# Patient Record
Sex: Male | Born: 1965 | Race: White | Hispanic: No | Marital: Married | Smoking: Former smoker
Health system: Southern US, Community
[De-identification: ages and names within clinical notes are randomized; demographics above are authoritative.]

## PROBLEM LIST (undated history)

## (undated) DIAGNOSIS — K219 Gastro-esophageal reflux disease without esophagitis: Secondary | ICD-10-CM

## (undated) DIAGNOSIS — I10 Essential (primary) hypertension: Secondary | ICD-10-CM

## (undated) DIAGNOSIS — M1990 Unspecified osteoarthritis, unspecified site: Secondary | ICD-10-CM

## (undated) DIAGNOSIS — E785 Hyperlipidemia, unspecified: Secondary | ICD-10-CM

## (undated) DIAGNOSIS — Z86010 Personal history of colonic polyps: Secondary | ICD-10-CM

## (undated) DIAGNOSIS — K449 Diaphragmatic hernia without obstruction or gangrene: Secondary | ICD-10-CM

## (undated) DIAGNOSIS — T7840XA Allergy, unspecified, initial encounter: Secondary | ICD-10-CM

## (undated) DIAGNOSIS — E039 Hypothyroidism, unspecified: Secondary | ICD-10-CM

## (undated) DIAGNOSIS — U071 COVID-19: Secondary | ICD-10-CM

## (undated) DIAGNOSIS — A048 Other specified bacterial intestinal infections: Secondary | ICD-10-CM

## (undated) HISTORY — PX: CHOLECYSTECTOMY: SHX55

## (undated) HISTORY — DX: Other specified bacterial intestinal infections: A04.8

## (undated) HISTORY — DX: Personal history of colonic polyps: Z86.010

## (undated) HISTORY — DX: Essential (primary) hypertension: I10

## (undated) HISTORY — DX: Hyperlipidemia, unspecified: E78.5

## (undated) HISTORY — PX: APPENDECTOMY: SHX54

## (undated) HISTORY — DX: COVID-19: U07.1

## (undated) HISTORY — DX: Unspecified osteoarthritis, unspecified site: M19.90

## (undated) HISTORY — DX: Gastro-esophageal reflux disease without esophagitis: K21.9

## (undated) HISTORY — PX: ANKLE SURGERY: SHX546

## (undated) HISTORY — DX: Allergy, unspecified, initial encounter: T78.40XA

## (undated) HISTORY — DX: Diaphragmatic hernia without obstruction or gangrene: K44.9

---

## 2013-08-10 ENCOUNTER — Ambulatory Visit (INDEPENDENT_AMBULATORY_CARE_PROVIDER_SITE_OTHER): Payer: 59 | Admitting: Internal Medicine

## 2013-08-10 ENCOUNTER — Encounter: Payer: Self-pay | Admitting: Internal Medicine

## 2013-08-10 VITALS — BP 140/90 | HR 80 | Temp 97.6°F | Resp 16 | Ht 70.0 in | Wt 184.0 lb

## 2013-08-10 DIAGNOSIS — Z5189 Encounter for other specified aftercare: Secondary | ICD-10-CM | POA: Insufficient documentation

## 2013-08-10 NOTE — Patient Instructions (Signed)
Wound Care Wound care helps prevent pain and infection.  You may need a tetanus shot if:  You cannot remember when you had your last tetanus shot.  You have never had a tetanus shot.  The injury broke your skin. If you need a tetanus shot and you choose not to have one, you may get tetanus. Sickness from tetanus can be serious. HOME CARE   Only take medicine as told by your doctor.  Clean the wound daily with mild soap and water.  Change any bandages (dressings) as told by your doctor.  Put medicated cream and a bandage on the wound as told by your doctor.  Change the bandage if it gets wet, dirty, or starts to smell.  Take showers. Do not take baths, swim, or do anything that puts your wound under water.  Rest and raise (elevate) the wound until the pain and puffiness (swelling) are better.  Keep all doctor visits as told. GET HELP RIGHT AWAY IF:   Yellowish-white fluid (pus) comes from the wound.  Medicine does not lessen your pain.  There is a red streak going away from the wound.  You have a fever. MAKE SURE YOU:   Understand these instructions.  Will watch your condition.  Will get help right away if you are not doing well or get worse. Document Released: 08/05/2008 Document Revised: 01/19/2012 Document Reviewed: 03/02/2011 Exit[employer] Patient Information 2014 [employer].  

## 2013-08-10 NOTE — Progress Notes (Signed)
  Subjective:    Patient ID: Gabriel Richards, male    DOB: [birth year], 47 y.o.   MRN: [id number]  Wound Check He was originally treated more than 14 days ago. Previous treatment included laceration repair. His temperature was unmeasured prior to arrival. There has been no drainage from the wound. There is no redness present. There is no swelling present. The pain has no pain. He has no difficulty moving the affected extremity or digit.      Review of Systems  All other systems reviewed and are negative.       Objective:   Physical Exam  Vitals reviewed. Constitutional: He is oriented to person, place, and time. He appears well-developed and well-nourished. No distress.  HENT:  Head: Normocephalic and atraumatic.  Mouth/Throat: Oropharynx is clear and moist. No oropharyngeal exudate.  Eyes: Conjunctivae are normal. Right eye exhibits no discharge. Left eye exhibits no discharge. No scleral icterus.  Neck: Normal range of motion. Neck supple. No JVD present. No tracheal deviation present. No thyromegaly present.  Cardiovascular: Normal rate, regular rhythm, normal heart sounds and intact distal pulses.  Exam reveals no gallop and no friction rub.   No murmur heard. Pulmonary/Chest: Effort normal and breath sounds normal. No stridor. No respiratory distress. He has no wheezes. He has no rales. He exhibits no tenderness.  Abdominal: Soft. Bowel sounds are normal. He exhibits no distension and no mass. There is no tenderness. There is no rebound and no guarding.  Musculoskeletal: Normal range of motion. He exhibits no edema and no tenderness.       Legs: Lymphadenopathy:    He has no cervical adenopathy.  Neurological: He is oriented to person, place, and time.  Skin: Skin is warm and dry. No rash noted. He is not diaphoretic. No erythema. No pallor.          Assessment & Plan:

## 2013-08-10 NOTE — Assessment & Plan Note (Signed)
Sutures were removed Wound is healing well without any signs of infection

## 2013-08-11 ENCOUNTER — Encounter: Payer: Self-pay | Admitting: Internal Medicine

## 2013-10-21 ENCOUNTER — Encounter: Payer: Self-pay | Admitting: Family Medicine

## 2013-10-21 ENCOUNTER — Other Ambulatory Visit (INDEPENDENT_AMBULATORY_CARE_PROVIDER_SITE_OTHER): Payer: 59

## 2013-10-21 ENCOUNTER — Ambulatory Visit (INDEPENDENT_AMBULATORY_CARE_PROVIDER_SITE_OTHER): Payer: 59 | Admitting: Family Medicine

## 2013-10-21 ENCOUNTER — Ambulatory Visit (INDEPENDENT_AMBULATORY_CARE_PROVIDER_SITE_OTHER)
Admission: RE | Admit: 2013-10-21 | Discharge: 2013-10-21 | Disposition: A | Discharge status: Home / Self Care | Payer: 59 | Source: Ambulatory Visit | Attending: Family Medicine | Admitting: Family Medicine

## 2013-10-21 VITALS — BP 140/86 | HR 78

## 2013-10-21 DIAGNOSIS — M25571 Pain in right ankle and joints of right foot: Secondary | ICD-10-CM

## 2013-10-21 DIAGNOSIS — M25579 Pain in unspecified ankle and joints of unspecified foot: Secondary | ICD-10-CM

## 2013-10-21 DIAGNOSIS — M19071 Primary osteoarthritis, right ankle and foot: Secondary | ICD-10-CM | POA: Insufficient documentation

## 2013-10-21 DIAGNOSIS — M19079 Primary osteoarthritis, unspecified ankle and foot: Secondary | ICD-10-CM

## 2013-10-21 NOTE — Assessment & Plan Note (Signed)
Patient does have severe osteophytic changes on physical exam in ultrasound today. X-rays were ordered reviewed and interpreted by me today. Patient does have severe osteophytic changes and does have postsurgical changes of the talus. I do not see any loosening of the screw head. This excluded from the differential. Unfortunately patient is having almost autofusion of the ankle mortise. We'll start with conservative therapy including bracing, home exercises, and over-the-counter medications. Patient will come back in 4 weeks and if he continues to have pain to consider doing a corticosteroid injection. Patient may need arthroscopic surgery to better release of this pain. We'll discuss further at followup.

## 2013-10-21 NOTE — Progress Notes (Signed)
Pre-visit discussion using our clinic review tool. No additional management support is needed unless otherwise documented below in the visit note.  

## 2013-10-21 NOTE — Patient Instructions (Signed)
Very nice to meet you Try the brace with activity. Try exercises on the CD.  Ice baths 20 minutes at end of day.  Take tylenol 650 mg three times a day is the best evidence based medicine we have for arthritis.  Glucosamine sulfate 750mg  twice a day is a supplement that has been shown to help moderate to severe arthritis. Vitamin D 1000 IU daily Fish oil 2 grams daily.  Tumeric 500mg  twice daily.  Capsaicin topically up to four times a day may also help with pain. We will get xrays today, go downstairs I will call you with the results.  Shoe inserts with good arch support may be helpful.  [employer] [facility] at [employer] [facility] could help.  Water aerobics and cycling with low resistance are the best two types of exercise for arthritis. Come back and see me in 3-4 weeks. '

## 2013-10-21 NOTE — Progress Notes (Signed)
I'm seeing this patient by the request  of:  Sanda Linger, MD   CC: Right ankle pain  HPI: Patient is a very pleasant 47 year old gentleman who back in 1988 off a 12 foot ladder and shattered his ankle. Patient did have surgery but does not remember exactly what type. Patient states over the course last 2 years she's been having worsening pain of his ankle. Patient is seen a podiatrist in an orthopedic surgeon for this pain previously. Patient was fitted with what sounded to be a custom posterior splint that seem to hurt more than help. Patient states that the pain seems to be more the anterior lateral aspect of the ankle. Patient says that the pain is worse with walking states there is some time swelling denies any numbness or tingling. Patient states that he is a throbbing pain at night they can be 10 out of 10 and states that Tylenol does not seem to be helpful   Past medical, surgical, family and social history reviewed. Medications reviewed all in the electronic medical record.   Review of Systems: No headache, visual changes, nausea, vomiting, diarrhea, constipation, dizziness, abdominal pain, skin rash, fevers, chills, night sweats, weight loss, swollen lymph nodes, body aches, joint swelling, muscle aches, chest pain, shortness of breath, mood changes.   Objective:    Blood pressure 140/86, pulse 78, SpO2 96.00%.   General: No apparent distress alert and oriented x3 mood and affect normal, dressed appropriately.  HEENT: Pupils equal, extraocular movements intact Respiratory: Patient's speak in full sentences and does not appear short of breath Cardiovascular: No lower extremity edema, non tender, no erythema Skin: Warm dry intact with no signs of infection or rash on extremities or on axial skeleton. Abdomen: Soft nontender Neuro: Cranial nerves II through XII are intact, neurovascularly intact in all extremities with 2+ DTRs and 2+ pulses. Lymph: No lymphadenopathy of posterior or  anterior cervical chain or axillae bilaterally. .  MSK: Non tender with full range of motion and good stability and symmetric strength and tone of shoulders, elbows, wrist, hip, knees bilaterally.  Ankle: Right On inspection patient does have trace amount of effusion on the lateral aspect. There is a incision over the anterior lateral aspect of the fibula. Patient has severe loss of range of motion in flexion and extension and the most entire loss of inversion and eversion. Strength is 5/5 in all directions. Stable lateral and medial ligaments; squeeze test and kleiger test unremarkable; Talar dome moderate tenderness No pain at base of 5th MT; No tenderness over cuboid; No tenderness over N spot or navicular prominence Patient does have some pain over the medial aspect of the fibula as well as the posterior aspect of the fibula No sign of peroneal tendon subluxations or tenderness to palpation Negative tarsal tunnel tinel's Able to walk 4 steps. Patient does ambulate with external rotated leg and a fixed ankle. This is very significant abnormal gait Contralateral ankle unremarkable  MSK US performed of: Right ankle This study was ordered, performed, and interpreted by Terrilee Files D.O.  Foot/Ankle:   All structures visualized.   Talar dome show severe os or arthritic changes Ankle mortise does have mild effusion Peroneus longus and brevis tendons unremarkable on long and transverse views without sheath effusions. Posterior tibialis, flexor hallucis longus, and flexor digitorum longus tendons unremarkable on long and transverse views without sheath effusions. Achilles tendon visualized along length of tendon and unremarkable on long and transverse views without sheath effusion. Anterior Talofibular Ligament does  not appear to be intact and does have hypoechoic changes in the area Deltoid Ligament unremarkable and intact. Plantar fascia intact and without effusion, normal thickness. No  increased doppler signal, cap sign, or thickening of tibial cortex. Power doppler signal normal.  IMPRESSION: Severe osteoarthritic changes of the ankle    Impression and Recommendations:     This case required medical decision making of moderate complexity.

## 2013-11-10 HISTORY — PX: UPPER GASTROINTESTINAL ENDOSCOPY: SHX188

## 2013-12-14 ENCOUNTER — Encounter: Payer: Self-pay | Admitting: Physician Assistant

## 2013-12-19 ENCOUNTER — Encounter: Payer: Self-pay | Admitting: Physician Assistant

## 2013-12-19 ENCOUNTER — Ambulatory Visit (INDEPENDENT_AMBULATORY_CARE_PROVIDER_SITE_OTHER): Payer: 59 | Admitting: Physician Assistant

## 2013-12-19 VITALS — BP 132/70 | HR 76 | Ht 70.0 in | Wt 190.0 lb

## 2013-12-19 DIAGNOSIS — R6881 Early satiety: Secondary | ICD-10-CM

## 2013-12-19 DIAGNOSIS — I10 Essential (primary) hypertension: Secondary | ICD-10-CM | POA: Insufficient documentation

## 2013-12-19 DIAGNOSIS — R1013 Epigastric pain: Secondary | ICD-10-CM

## 2013-12-19 NOTE — Progress Notes (Signed)
Reviewed and agree with management plan.  Thang Flett T. Atha Mcbain, MD FACG 

## 2013-12-19 NOTE — Progress Notes (Signed)
Subjective:    Patient ID: Gabriel Richards, male    DOB: Jul 31, 1966, 48 y.o.   MRN: [id number]  HPI  Gabriel Richards is a pleasant 48 year old white male new to GI today, self-referred. He has history of hypertension and hyperlipidemia. He says he has history of acid reflux and has been on omeprazole over the past few years. He is now having a somewhat progressive symptoms with epigastric and subxiphoid discomfort. He says when he eats he feels like he fills  Up quickly andas if something in his abdomen is pushing up on his lungs.. He has no dysphagia or odynophagia. In the evenings when he lies down to go to bed he says he gets a gas buildup type of feeling in his lower chest area and has hard time getting to sleep. He has been taking a little bit of mag citrate before bedtime which was helpful for this and now recently a switch to vinegar  which seems to help as well. He has not had any nausea or vomiting. No changes in his bowel habits melena or hematochezia. His weight has been stable. He is not on any regular aspirin or NSAIDs and does not drink any alcohol regularly. Family history is negative for GI disease. He is status post appendectomy.    Review of Systems  Constitutional: Positive for appetite change.  HENT: Negative.   Eyes: Negative.   Respiratory: Positive for shortness of breath.   Cardiovascular: Negative.   Gastrointestinal: Positive for abdominal pain and abdominal distention.  Endocrine: Negative.   Genitourinary: Negative.   Allergic/Immunologic: Negative.   Neurological: Negative.   Hematological: Negative.   Psychiatric/Behavioral: Negative.    Outpatient Encounter Prescriptions as of 12/19/2013  Medication Sig  . Cholecalciferol (HM VITAMIN D3) 4000 UNITS CAPS Take by mouth. Once daily  . lisinopril (PRINIVIL,ZESTRIL) 10 MG tablet Take 10 mg by mouth daily.  Marland Kitchen omeprazole (PRILOSEC) 40 MG capsule Take 40 mg by mouth daily.  . rosuvastatin (CRESTOR) 10 MG tablet Take 10 mg by  mouth daily.    No Known Allergies Patient Active Problem List   Diagnosis Date Noted  . HTN (hypertension) 12/19/2013  . Arthritis of ankle, right, degenerative 10/21/2013  . Encounter for wound Richards-check 08/10/2013   History  Substance Use Topics  . Smoking status: Never Smoker   . Smokeless tobacco: Never Used  . Alcohol Use: No   family history includes Diabetes in his father; Heart disease in his father; Hyperlipidemia in his brother and father; Hypertension in his brother and father. There is no history of Early death, Cancer, COPD, Drug abuse, Alcohol abuse, Kidney disease, or Stroke.     Objective:   Physical Exam well-developed white male in no acute distress, pleasant blood pressure 132/70 pulse 76 height 5 foot 10 weight 190. H;EENT ;nontraumatic normocephalic EOMI PERRLA sclera anicteric, Supple; no JVD, Cardiovascular regular rate and rhythm with S1-S2 no murmur or gallop, Pulmonary clear bilaterally, Abdomen; soft nondistended bowel sounds are present there is no succussion splash is some mild tenderness in the epigastrium no guarding or rebound, Rectal; exam not done, Extremities; no clubbing cyanosis or edema skin warm and dry, Psych; mood and affect appropriate        Assessment & Plan:  #48  48 year old white male with history of chronic GERD now with progressive symptoms of epigastric and subxiphoid discomfort, pressure and early satiety postprandially and at nighttime. His symptoms may all be due to acid reflux and perhaps a hiatal hernia, rule out  occult lesion, rule out partially intrathoracic stomach  #2 hypertension  Plan; Increase omeprazole to 40 mg by mouth twice daily with a second dose at dinnertime Patient signed a release we can get copies of his recent labs done for a physical with  his primary care physician's office Schedule for upper endoscopy with Dr. Merita Norton discussed in detail with patient he is agreeable to proceed Further plans pending  results of above

## 2013-12-19 NOTE — Patient Instructions (Signed)
You have been scheduled for an endoscopy with propofol. Please follow written instructions given to you at your visit today. If you use inhalers (even only as needed), please bring them with you on the day of your procedure. 

## 2013-12-22 ENCOUNTER — Encounter: Payer: Self-pay | Admitting: Gastroenterology

## 2013-12-23 ENCOUNTER — Encounter: Payer: Self-pay | Admitting: Gastroenterology

## 2013-12-23 ENCOUNTER — Ambulatory Visit (AMBULATORY_SURGERY_CENTER): Payer: 59 | Admitting: Gastroenterology

## 2013-12-23 VITALS — BP 132/80 | HR 66 | Temp 96.8°F | Resp 20 | Ht 70.0 in | Wt 190.0 lb

## 2013-12-23 DIAGNOSIS — R6881 Early satiety: Secondary | ICD-10-CM

## 2013-12-23 DIAGNOSIS — A048 Other specified bacterial intestinal infections: Secondary | ICD-10-CM

## 2013-12-23 DIAGNOSIS — R1013 Epigastric pain: Secondary | ICD-10-CM

## 2013-12-23 DIAGNOSIS — K2990 Gastroduodenitis, unspecified, without bleeding: Secondary | ICD-10-CM

## 2013-12-23 DIAGNOSIS — K219 Gastro-esophageal reflux disease without esophagitis: Secondary | ICD-10-CM

## 2013-12-23 DIAGNOSIS — K2970 Gastritis, unspecified, without bleeding: Secondary | ICD-10-CM

## 2013-12-23 DIAGNOSIS — Z8619 Personal history of other infectious and parasitic diseases: Secondary | ICD-10-CM | POA: Insufficient documentation

## 2013-12-23 MED ORDER — SODIUM CHLORIDE 0.9 % IV SOLN: 500.0000 mL | INTRAVENOUS | Status: DC

## 2013-12-23 NOTE — Patient Instructions (Addendum)

## 2013-12-23 NOTE — Progress Notes (Signed)
Called to room to assist during endoscopic procedure.  Patient ID and intended procedure confirmed with present staff. Received instructions for my participation in the procedure from the performing physician.  

## 2013-12-23 NOTE — Progress Notes (Signed)
No egg or soy allergy. ewm No problems with past sedation. ewm

## 2013-12-23 NOTE — Op Note (Signed)
[place]  [address] [place], [id number]   ENDOSCOPY PROCEDURE REPORT  PATIENT: Gabriel Richards, Gabriel Richards  MR#: [id number] BIRTHDATE: 30-Nov-1965 , 56  yrs. old GENDER: Male ENDOSCOPIST: Ladene Artist, MD, [facility] PROCEDURE DATE:  12/23/2013 PROCEDURE:  EGD w/ biopsy ASA CLASS:     Class II INDICATIONS:  Epigastric pain.   early satiety.   History of esophageal reflux. MEDICATIONS: MAC sedation, administered by CRNA and propofol (Diprivan) 250mg  IV TOPICAL ANESTHETIC: none DESCRIPTION OF PROCEDURE: After the risks benefits and alternatives of the procedure were thoroughly explained, informed consent was obtained.  The LB [id number] endoscope was introduced through the mouth and advanced to the second portion of the duodenum. Without limitations.  The instrument was slowly withdrawn as the mucosa was fully examined.   ESOPHAGUS: The mucosa of the esophagus appeared normal. STOMACH: Mild gastritis  was found in the gastric body and gastric fundus.  Multiple biopsies were performed.   The stomach otherwise appeared normal. DUODENUM: The duodenal mucosa showed no abnormalities in the bulb and second portion of the duodenum.  Retroflexed views revealed a small hiatal hernia.     The scope was then withdrawn from the patient and the procedure completed.  COMPLICATIONS: There were no complications.  ENDOSCOPIC IMPRESSION: 1.   Small hiatal hernia 2.   Gastritis in the gastric body and gastric fundus; multiple biopsies  RECOMMENDATIONS: 1.  Anti-reflux regimen 2.  Continue PPI BID 3.  Await pathology 4.  Call to schedule a follow-up appointment with office 4-6weeks   eSigned:  Ladene Artist, MD, [facility] 12/23/2013 4:27 PM

## 2013-12-23 NOTE — Progress Notes (Signed)
A/ox3 pleased with MAC, report to April RN 

## 2013-12-26 ENCOUNTER — Telehealth: Payer: Self-pay | Admitting: *Deleted

## 2013-12-26 NOTE — Telephone Encounter (Signed)
  Follow up Call-  Call back number 12/23/2013  Post procedure Call Back phone  # [phone]  Permission to leave phone message Yes     Patient questions:  Do you have a fever, pain , or abdominal swelling? no Pain Score  0 *  Have you tolerated food without any problems? yes  Have you been able to return to your normal activities? yes  Do you have any questions about your discharge instructions: Diet   no Medications  no Follow up visit  no  Do you have questions or concerns about your Care? no  Actions: * If pain score is 4 or above: No action needed, pain <4.

## 2013-12-29 ENCOUNTER — Encounter: Payer: Self-pay | Admitting: Gastroenterology

## 2013-12-30 ENCOUNTER — Encounter: Payer: Self-pay | Admitting: Physician Assistant

## 2013-12-30 ENCOUNTER — Telehealth: Payer: Self-pay | Admitting: Gastroenterology

## 2013-12-30 ENCOUNTER — Other Ambulatory Visit: Payer: Self-pay

## 2013-12-30 MED ORDER — BIS SUBCIT-METRONID-TETRACYC 140-125-125 MG PO CAPS: 3.0000 | ORAL_CAPSULE | Freq: Three times a day (TID) | ORAL | Status: DC

## 2013-12-30 MED ORDER — OMEPRAZOLE 40 MG PO CPDR: 40.0000 mg | DELAYED_RELEASE_CAPSULE | Freq: Two times a day (BID) | ORAL | Status: DC

## 2013-12-30 NOTE — Telephone Encounter (Signed)
Informed patient's husband that we have a sample 10 day pac of Pylera I can give to the patient. Tammy states she will pick the samples on the dumb waiter.

## 2014-01-09 ENCOUNTER — Telehealth: Payer: Self-pay | Admitting: Gastroenterology

## 2014-01-09 NOTE — Telephone Encounter (Signed)
Patient is currently taking Pylera.  He is advised that dark loose stools are the most likely coming from Pylera.  He is asked to call back if the symptoms persist after several days off of Pylera

## 2014-02-27 ENCOUNTER — Ambulatory Visit: Payer: 59 | Admitting: Gastroenterology

## 2014-03-28 ENCOUNTER — Other Ambulatory Visit: Payer: Self-pay | Admitting: Gastroenterology

## 2014-03-29 ENCOUNTER — Telehealth: Payer: Self-pay | Admitting: Gastroenterology

## 2014-03-29 MED ORDER — OMEPRAZOLE 40 MG PO CPDR: 40.0000 mg | DELAYED_RELEASE_CAPSULE | Freq: Two times a day (BID) | ORAL | Status: DC

## 2014-03-29 NOTE — Telephone Encounter (Signed)
Prescription sent to patient's pharmacy.

## 2014-04-13 ENCOUNTER — Encounter: Payer: Self-pay | Admitting: Gastroenterology

## 2014-04-13 ENCOUNTER — Ambulatory Visit (INDEPENDENT_AMBULATORY_CARE_PROVIDER_SITE_OTHER): Payer: 59 | Admitting: Gastroenterology

## 2014-04-13 VITALS — BP 120/84 | HR 72 | Ht 68.5 in | Wt 190.2 lb

## 2014-04-13 DIAGNOSIS — K219 Gastro-esophageal reflux disease without esophagitis: Secondary | ICD-10-CM

## 2014-04-13 NOTE — Progress Notes (Signed)
    History of Present Illness: This is a 48 year old male with GERD and a history of H. pylori gastritis treated in February. He has no GI complaints.  Current Medications, Allergies, Past Medical History, Past Surgical History, Family History and Social History were reviewed in [employer] record.  Physical Exam: General: Well developed , well nourished, no acute distress Head: Normocephalic and atraumatic Eyes:  sclerae anicteric, EOMI Ears: Normal auditory acuity Mouth: No deformity or lesions Lungs: Clear throughout to auscultation Heart: Regular rate and rhythm; no murmurs, rubs or bruits Abdomen: Soft, non tender and non distended. No masses, hepatosplenomegaly or hernias noted. Normal Bowel sounds Musculoskeletal: Symmetrical with no gross deformities  Extremities: No clubbing, cyanosis, edema or deformities noted Neurological: Alert oriented x 4, grossly nonfocal Psychological:  Alert and cooperative. Normal mood and affect  Assessment and Recommendations:  1. H. pylori gastritis, treated.   2. GERD. Continue standard antireflux measures and omeprazole 40 mg twice a day.

## 2014-04-13 NOTE — Patient Instructions (Signed)
Thank you for choosing me and [facility].  Malcolm T. Stark, Jr., MD., FACG  

## 2014-05-18 ENCOUNTER — Other Ambulatory Visit: Payer: Self-pay | Admitting: Gastroenterology

## 2015-02-22 ENCOUNTER — Encounter (HOSPITAL_BASED_OUTPATIENT_CLINIC_OR_DEPARTMENT_OTHER): Payer: Self-pay | Admitting: *Deleted

## 2015-02-22 ENCOUNTER — Emergency Department (HOSPITAL_BASED_OUTPATIENT_CLINIC_OR_DEPARTMENT_OTHER): Payer: 59

## 2015-02-22 ENCOUNTER — Emergency Department (HOSPITAL_BASED_OUTPATIENT_CLINIC_OR_DEPARTMENT_OTHER)
Admission: EM | Admit: 2015-02-22 | Discharge: 2015-02-22 | Disposition: A | Discharge status: Home / Self Care | Payer: 59 | Attending: Emergency Medicine | Admitting: Emergency Medicine

## 2015-02-22 DIAGNOSIS — Y998 Other external cause status: Secondary | ICD-10-CM | POA: Diagnosis not present

## 2015-02-22 DIAGNOSIS — Z7982 Long term (current) use of aspirin: Secondary | ICD-10-CM | POA: Insufficient documentation

## 2015-02-22 DIAGNOSIS — Z87891 Personal history of nicotine dependence: Secondary | ICD-10-CM | POA: Insufficient documentation

## 2015-02-22 DIAGNOSIS — K219 Gastro-esophageal reflux disease without esophagitis: Secondary | ICD-10-CM | POA: Diagnosis not present

## 2015-02-22 DIAGNOSIS — X58XXXA Exposure to other specified factors, initial encounter: Secondary | ICD-10-CM | POA: Diagnosis not present

## 2015-02-22 DIAGNOSIS — I10 Essential (primary) hypertension: Secondary | ICD-10-CM | POA: Insufficient documentation

## 2015-02-22 DIAGNOSIS — M1990 Unspecified osteoarthritis, unspecified site: Secondary | ICD-10-CM | POA: Insufficient documentation

## 2015-02-22 DIAGNOSIS — S29011A Strain of muscle and tendon of front wall of thorax, initial encounter: Secondary | ICD-10-CM | POA: Diagnosis not present

## 2015-02-22 DIAGNOSIS — Z8619 Personal history of other infectious and parasitic diseases: Secondary | ICD-10-CM | POA: Insufficient documentation

## 2015-02-22 DIAGNOSIS — Y9389 Activity, other specified: Secondary | ICD-10-CM | POA: Diagnosis not present

## 2015-02-22 DIAGNOSIS — Z79899 Other long term (current) drug therapy: Secondary | ICD-10-CM | POA: Diagnosis not present

## 2015-02-22 DIAGNOSIS — S299XXA Unspecified injury of thorax, initial encounter: Secondary | ICD-10-CM | POA: Diagnosis present

## 2015-02-22 DIAGNOSIS — Y9289 Other specified places as the place of occurrence of the external cause: Secondary | ICD-10-CM | POA: Insufficient documentation

## 2015-02-22 NOTE — ED Notes (Signed)
Pt reports was lifting commercial refrigerator 2 days ago over a step and felt a pop in right side of chest- felt better until this morning when he sneezed and the pain got worse

## 2015-02-22 NOTE — ED Provider Notes (Signed)
CSN: [id number]     Arrival date & time 02/22/15  1228 History   First MD Initiated Contact with Patient 02/22/15 1500     Chief Complaint  Patient presents with  . Chest Pain    Rib pain     (Consider location/radiation/quality/duration/timing/severity/associated sxs/prior Treatment) HPI  49 year old male presents with right anterior chest wall pain since lifting a refrigerator 2 days ago. He was pulling the dolly with his right arm and bowel straining he ended up feeling a pop and immediate pain under his right breast. Occasionally hurts to breathe. Ibuprofen does help the pain moderately. Last night he went to bed with no pain but this morning after sneezing the pain recurred. No bruising noted by the patient. No fevers or chills. No shortness of breath. Rates the pain as moderate at this time.  Past Medical History  Diagnosis Date  . Hypertension   . Arthritis   . GERD (gastroesophageal reflux disease)   . Allergy   . H. pylori infection   . Hiatal hernia    Past Surgical History  Procedure Laterality Date  . Appendectomy    . Ankle surgery     Family History  Problem Relation Age of Onset  . Hyperlipidemia Father   . Heart disease Father   . Hypertension Father   . Diabetes Father   . Hypertension Brother   . Hyperlipidemia Brother   . Early death Neg Hx   . Cancer Neg Hx   . COPD Neg Hx   . Drug abuse Neg Hx   . Alcohol abuse Neg Hx   . Kidney disease Neg Hx   . Stroke Neg Hx   . Colon cancer Neg Hx   . Esophageal cancer Neg Hx   . Rectal cancer Neg Hx   . Stomach cancer Neg Hx    History  Substance Use Topics  . Smoking status: Former Research scientist (life sciences)  . Smokeless tobacco: Never Used  . Alcohol Use: No    Review of Systems  Constitutional: Negative for fever.  Respiratory: Negative for shortness of breath.   Cardiovascular: Positive for chest pain.  Gastrointestinal: Negative for vomiting.  All other systems reviewed and are negative.     Allergies    Review of patient's allergies indicates no known allergies.  Home Medications   Prior to Admission medications   Medication Sig Start Date End Date Taking? Authorizing Provider  aspirin 81 MG tablet Take 81 mg by mouth daily.   Yes Historical Provider, MD  Cholecalciferol (HM VITAMIN D3) 4000 UNITS CAPS Take by mouth. Once daily   Yes Historical Provider, MD  omeprazole (PRILOSEC) 40 MG capsule TAKE 1 CAPSULE BY MOUTH TWICE DAILY 05/18/14  Yes Ladene Artist, MD  rosuvastatin (CRESTOR) 10 MG tablet Take 10 mg by mouth daily.   Yes Historical Provider, MD  lisinopril (PRINIVIL,ZESTRIL) 10 MG tablet Take 10 mg by mouth daily.    Historical Provider, MD   BP 163/87 mmHg  Pulse 78  Temp(Src) 97.7 F (36.5 C) (Oral)  Resp 18  Ht 5\' 10"  (1.778 m)  Wt 190 lb (86.183 kg)  BMI 27.26 kg/m2  SpO2 98% Physical Exam  Constitutional: He is oriented to person, place, and time. He appears well-developed and well-nourished.  HENT:  Head: Normocephalic and atraumatic.  Right Ear: External ear normal.  Left Ear: External ear normal.  Nose: Nose normal.  Eyes: Right eye exhibits no discharge. Left eye exhibits no discharge.  Neck: Neck supple.  Cardiovascular:  Normal rate, regular rhythm, normal heart sounds and intact distal pulses.   Pulmonary/Chest: Effort normal and breath sounds normal. He exhibits tenderness.    Abdominal: Soft. He exhibits no distension. There is no tenderness.  Neurological: He is alert and oriented to person, place, and time.  Skin: Skin is warm and dry.  Nursing note and vitals reviewed.   ED Course  Procedures (including critical care time) Labs Review Labs Reviewed - No data to display  Imaging Review Dg Chest 2 View  02/22/2015   CLINICAL DATA:  Right-sided chest pain and mild difficulty breathing  EXAM: CHEST  2 VIEW  COMPARISON:  None.  FINDINGS: There is minimal scarring in each lung base. Lungs elsewhere clear. Heart size and pulmonary vascularity are  normal. No adenopathy. No pneumothorax. No bone lesions.  IMPRESSION: Slight bibasilar scarring.  No edema or consolidation.   Electronically Signed   By: Lowella Grip III M.D.   On: 02/22/2015 13:50     EKG Interpretation None      MDM   Final diagnoses:  Chest wall muscle strain, initial encounter    Patient symptoms are consistent with a chest wall muscle strain. Patient feels comfortable taking ibuprofen and Tylenol at home, will ice as needed as well. Offered stronger pain medicine but this point he feels that this is adequate. This is not consistent with ACS, PE, or other acute chest pathology given this occurred as an injury and is very reproducible. Stable for discharge home.    Sherwood Gambler, MD 02/22/15 [phone]

## 2015-06-11 ENCOUNTER — Other Ambulatory Visit: Payer: Self-pay | Admitting: Gastroenterology

## 2015-07-04 ENCOUNTER — Ambulatory Visit: Payer: 59 | Admitting: Family Medicine

## 2015-07-10 ENCOUNTER — Other Ambulatory Visit (INDEPENDENT_AMBULATORY_CARE_PROVIDER_SITE_OTHER): Payer: Commercial Managed Care - HMO

## 2015-07-10 ENCOUNTER — Ambulatory Visit (INDEPENDENT_AMBULATORY_CARE_PROVIDER_SITE_OTHER): Payer: Commercial Managed Care - HMO | Admitting: Family Medicine

## 2015-07-10 ENCOUNTER — Encounter: Payer: Self-pay | Admitting: Family Medicine

## 2015-07-10 VITALS — BP 132/78 | HR 99 | Wt 192.0 lb

## 2015-07-10 DIAGNOSIS — M79644 Pain in right finger(s): Secondary | ICD-10-CM

## 2015-07-10 DIAGNOSIS — S92911A Unspecified fracture of right toe(s), initial encounter for closed fracture: Secondary | ICD-10-CM

## 2015-07-10 DIAGNOSIS — M6530 Trigger finger, unspecified finger: Secondary | ICD-10-CM

## 2015-07-10 NOTE — Assessment & Plan Note (Signed)
Patient was put in a postop boot. We discussed that this can take 3 weeks. Increase his vitamin D 5000 daily. We discussed icing regimen. 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 Patient will need custom orthotics in the long run. Return in 3 weeks.

## 2015-07-10 NOTE — Patient Instructions (Signed)
Good to see you Ice 20 minutes 2 times daily. Usually after activity and before bed. Wear brace today and tonight then nightly for 2 weeks Ibuprofen if you need it  See me again in 2 weeks if not perfect,

## 2015-07-10 NOTE — Assessment & Plan Note (Signed)
Patient does have more of a trigger finger. Patient was given an injection today in the middle finger. Didn't tolerate very well. We discussed icing regimen, and bracing for the next 2 weeks, and discussed that we can do this again. Prognosis is good. Follow-up in 2-3 weeks.

## 2015-07-10 NOTE — Progress Notes (Signed)
Pre visit review using our clinic review tool, if applicable. No additional management support is needed unless otherwise documented below in the visit note. 

## 2015-07-10 NOTE — Progress Notes (Signed)
[facility] [address] [place], [place] [address] Phone: [phone] Subjective:      CC: Right hand pain Right foot pain  [id number] Gabriel Richards is a 49 y.o. male coming in with complaint of complaint of 2 problems.  Right hand pain. Patient states the middle finger seems to get stuck especially in the mornings in a flexed position. States it's very painful to straighten. Starting affects some of his job. Patient denies any nighttime awakening secondary to. Denies any numbness or weakness. Patient though is in [employer] and is having difficulty continuing his job.  Patient is also having a right foot pain. Patient states that 2 months ago he dropped something on his foot. Had some swelling as well as bruising initially. States since then it seems to be worsening. Just proximal to the first toe is where most of his pain as he states. Worse with ambulation. Denies any numbness. Denies any swelling or bruising now. Makes it difficult to walk a lot. No nighttime awakening.  Past Medical History  Diagnosis Date  . Hypertension   . Arthritis   . GERD (gastroesophageal reflux disease)   . Allergy   . H. pylori infection   . Hiatal hernia    Past Surgical History  Procedure Laterality Date  . Appendectomy    . Ankle surgery     Social History  Substance Use Topics  . Smoking status: Former Research scientist (life sciences)  . Smokeless tobacco: Never Used  . Alcohol Use: No   No Known Allergies Family History  Problem Relation Age of Onset  . Hyperlipidemia Father   . Heart disease Father   . Hypertension Father   . Diabetes Father   . Hypertension Brother   . Hyperlipidemia Brother   . Early death Neg Hx   . Cancer Neg Hx   . COPD Neg Hx   . Drug abuse Neg Hx   . Alcohol abuse Neg Hx   . Kidney disease Neg Hx   . Stroke Neg Hx   . Colon cancer Neg Hx   . Esophageal cancer Neg Hx   . Rectal cancer Neg Hx   . Stomach cancer Neg Hx         Past  medical history, social, surgical and family history all reviewed in electronic medical record.   Review of Systems: No headache, visual changes, nausea, vomiting, diarrhea, constipation, dizziness, abdominal pain, skin rash, fevers, chills, night sweats, weight loss, swollen lymph nodes, body aches, joint swelling, muscle aches, chest pain, shortness of breath, mood changes.   Objective Blood pressure 132/78, pulse 99, weight 192 lb (87.091 kg), SpO2 95 %.  General: No apparent distress alert and oriented x3 mood and affect normal, dressed appropriately.  HEENT: Pupils equal, extraocular movements intact  Respiratory: Patient's speak in full sentences and does not appear short of breath  Cardiovascular: No lower extremity edema, non tender, no erythema  Skin: Warm dry intact with no signs of infection or rash on extremities or on axial skeleton.  Abdomen: Soft nontender  Neuro: Cranial nerves II through XII are intact, neurovascularly intact in all extremities with 2+ DTRs and 2+ pulses.  Lymph: No lymphadenopathy of posterior or anterior cervical chain or axillae bilaterally.  Gait normal with good balance and coordination.  MSK:  Non tender with full range of motion and good stability and symmetric strength and tone of shoulders, elbows, wrist, hip, knee and ankles bilaterally.  Hand exam: On patient's right hand third  finger does have a trigger finger at the A2 pulley. Tender to palpation. Neurovascularly intact distally. Does have full range of motion. Minor trigger noted. Good capillary refill. Full range of motion and strength at the wrist. Foot exam: Right foot exam shows the patient does have fibular deviation of the large toe as well as pes planus. Patient is tender to palpation just proximal to the MTP joint on the first toe. Neurovascularly intact distally.  Procedure note After verbal consent patient was prepped with alcohol swab and with a 25-gauge 1 inch needle patient was injected  with a total of 0.5 mL of 0.5% Marcaine and 0.5 mL of Kenalog 40 mg/dL within the tendon sheath of the middle finger on the flexor portion of the hand. This is done under ultrasound guidance at the trigger nodule. Patient tolerated the procedure well. Pain significantly improved immediately. Post injection instructions given.  Limited musculoskeletal ultrasound was performed and interpreted by Gabriel Richards, Gabriel Richards  Today. Limited ultrasound patient's first metatarsal shows proximal to the first MTP patient does have a fracture that is minimally healing.. Mild callus formation noted. Mild erosion of the bone noted. Patient's first MTP joint does have what appears to be layering. Impression: Fracture of the first metatarsal proximal to MTP joint articular, questionable gout   Impression and Recommendations:     This case required medical decision making of moderate complexity.

## 2015-07-24 ENCOUNTER — Encounter: Payer: Self-pay | Admitting: Family Medicine

## 2015-07-24 ENCOUNTER — Ambulatory Visit (INDEPENDENT_AMBULATORY_CARE_PROVIDER_SITE_OTHER): Payer: Commercial Managed Care - HMO | Admitting: Family Medicine

## 2015-07-24 VITALS — BP 122/84 | HR 96 | Ht 70.0 in | Wt 192.0 lb

## 2015-07-24 DIAGNOSIS — S92911A Unspecified fracture of right toe(s), initial encounter for closed fracture: Secondary | ICD-10-CM

## 2015-07-24 DIAGNOSIS — M6530 Trigger finger, unspecified finger: Secondary | ICD-10-CM

## 2015-07-24 NOTE — Progress Notes (Signed)
[facility] [place],  [address] Phone: [phone] Subjective:      CC: Right hand pain f/u Right foot pain f/u  [id number] Gabriel Richards is a 49 y.o. male coming in with complaint of complaint of 2 problems.  Right hand pain. Patient was to have a trigger finger and was given an injection at last follow-up. Patient was to do bracing at night, home exercises, icing. Patient states finger is completely better at this time.  Patient is also having a right foot pain. Patient was found to have a first metatarsal fracture. States that he is feeling much better. Has been wearing the boot faithfully    Past Medical History  Diagnosis Date  . Hypertension   . Arthritis   . GERD (gastroesophageal reflux disease)   . Allergy   . H. pylori infection   . Hiatal hernia    Past Surgical History  Procedure Laterality Date  . Appendectomy    . Ankle surgery     Social History  Substance Use Topics  . Smoking status: Former Research scientist (life sciences)  . Smokeless tobacco: Never Used  . Alcohol Use: No   No Known Allergies Family History  Problem Relation Age of Onset  . Hyperlipidemia Father   . Heart disease Father   . Hypertension Father   . Diabetes Father   . Hypertension Brother   . Hyperlipidemia Brother   . Early death Neg Hx   . Cancer Neg Hx   . COPD Neg Hx   . Drug abuse Neg Hx   . Alcohol abuse Neg Hx   . Kidney disease Neg Hx   . Stroke Neg Hx   . Colon cancer Neg Hx   . Esophageal cancer Neg Hx   . Rectal cancer Neg Hx   . Stomach cancer Neg Hx         Past medical history, social, surgical and family history all reviewed in electronic medical record.   Review of Systems: No headache, visual changes, nausea, vomiting, diarrhea, constipation, dizziness, abdominal pain, skin rash, fevers, chills, night sweats, weight loss, swollen lymph nodes, body aches, joint swelling, muscle aches, chest pain, shortness of breath, mood  changes.   Objective There were no vitals taken for this visit.  General: No apparent distress alert and oriented x3 mood and affect normal, dressed appropriately.  HEENT: Pupils equal, extraocular movements intact  Respiratory: Patient's speak in full sentences and does not appear short of breath  Cardiovascular: No lower extremity edema, non tender, no erythema  Skin: Warm dry intact with no signs of infection or rash on extremities or on axial skeleton.  Abdomen: Soft nontender  Neuro: Cranial nerves II through XII are intact, neurovascularly intact in all extremities with 2+ DTRs and 2+ pulses.  Lymph: No lymphadenopathy of posterior or anterior cervical chain or axillae bilaterally.  Gait normal with good balance and coordination.  MSK:  Non tender with full range of motion and good stability and symmetric strength and tone of shoulders, elbows, wrist, hip, knee and ankles bilaterally.  Hand exam: no triggering noted and nontender with full range of motion of the finger. Foot exam: Right foot exam shows the patient does have fibular deviation of the large toe as well as pes planus.minimal tenderness still of the first MTP joint just proximal multiple this area.   Limited musculoskeletal ultrasound was performed and interpreted by Hulan Saas, M  Today. Limited ultrasound patient's first metatarsal shows proximal  to the first MTP shows the patient's fracture is healing and does have a good hard callus formation noted. Mild hypoechoic changes still noted. Impression:significant callus formation proximal to the first MTP joint showing healing  Impression and Recommendations:     This case required medical decision making of moderate complexity.

## 2015-07-24 NOTE — Patient Instructions (Signed)
Good to see you Ok to wear rigid sole shoe now in the house and short errands Net 10 days still wear boot when walking a lot of on hard surfaces Ice still at the end of the day Continue the vitamin D See me again in 1 month and should be near pain free.

## 2015-07-24 NOTE — Assessment & Plan Note (Signed)
Completely resolved at this time. 

## 2015-07-24 NOTE — Assessment & Plan Note (Signed)
Patient is doing better at this time. Patient slowly get out of the [place] at this time. Patient will wear a rigid sole shoe. Continue on the vitamin D supplementation. We discussed the icing pedicle. Expect some discomfort but no pain. We'll take another 2-3 weeks to fully heal. We'll see patient again in 4 weeks.

## 2015-07-24 NOTE — Progress Notes (Signed)
Pre visit review using our clinic review tool, if applicable. No additional management support is needed unless otherwise documented below in the visit note. 

## 2015-08-06 ENCOUNTER — Other Ambulatory Visit: Payer: Self-pay

## 2015-08-06 MED ORDER — OMEPRAZOLE 40 MG PO CPDR: 40.0000 mg | DELAYED_RELEASE_CAPSULE | Freq: Two times a day (BID) | ORAL | Status: DC

## 2015-08-23 ENCOUNTER — Ambulatory Visit: Payer: Commercial Managed Care - HMO | Admitting: Internal Medicine

## 2015-08-28 ENCOUNTER — Ambulatory Visit: Payer: Commercial Managed Care - HMO | Admitting: Family Medicine

## 2015-09-07 ENCOUNTER — Telehealth: Payer: Self-pay | Admitting: Gastroenterology

## 2015-09-07 MED ORDER — OMEPRAZOLE 40 MG PO CPDR: 40.0000 mg | DELAYED_RELEASE_CAPSULE | Freq: Two times a day (BID) | ORAL | Status: DC

## 2015-09-07 NOTE — Telephone Encounter (Signed)
Prescription sent to patient's pharmacy and told to keep appt for any further refills.

## 2015-09-18 ENCOUNTER — Encounter: Payer: Self-pay | Admitting: Gastroenterology

## 2015-09-18 ENCOUNTER — Ambulatory Visit (INDEPENDENT_AMBULATORY_CARE_PROVIDER_SITE_OTHER): Payer: Commercial Managed Care - HMO | Admitting: Gastroenterology

## 2015-09-18 ENCOUNTER — Ambulatory Visit: Payer: Commercial Managed Care - HMO | Admitting: Gastroenterology

## 2015-09-18 VITALS — BP 130/88 | HR 68 | Ht 70.0 in | Wt 192.2 lb

## 2015-09-18 DIAGNOSIS — K219 Gastro-esophageal reflux disease without esophagitis: Secondary | ICD-10-CM

## 2015-09-18 DIAGNOSIS — K5900 Constipation, unspecified: Secondary | ICD-10-CM | POA: Diagnosis not present

## 2015-09-18 DIAGNOSIS — Z1211 Encounter for screening for malignant neoplasm of colon: Secondary | ICD-10-CM | POA: Diagnosis not present

## 2015-09-18 MED ORDER — OMEPRAZOLE 40 MG PO CPDR: 40.0000 mg | DELAYED_RELEASE_CAPSULE | Freq: Two times a day (BID) | ORAL | Status: DC

## 2015-09-18 NOTE — Progress Notes (Signed)
    History of Present Illness: This is a 49 year old male with chronic GERD. Symptoms well controlled on current regimen. He notes breakthrough symptoms if he misses a dose and notes regurgitation when bending over with a relatively full stomach. He relates problems with intermittent constipation associated with upper abdominal discomfort and abdominal bloating. He has taken magnesium citrate on a few occasions with good results. He has no other gastrointestinal complaints. Denies weight loss,  diarrhea, change in stool caliber, melena, hematochezia, nausea, vomiting, dysphagia, chest pain.  Current Medications, Allergies, Past Medical History, Past Surgical History, Family History and Social History were reviewed in [employer] record.  Physical Exam: General: Well developed, well nourished, no acute distress Head: Normocephalic and atraumatic Eyes:  sclerae anicteric, EOMI Ears: Normal auditory acuity Mouth: No deformity or lesions Lungs: Clear throughout to auscultation Heart: Regular rate and rhythm; no murmurs, rubs or bruits Abdomen: Soft, non tender and non distended. No masses, hepatosplenomegaly or hernias noted. Normal Bowel sounds Musculoskeletal: Symmetrical with no gross deformities  Pulses:  Normal pulses noted Extremities: No clubbing, cyanosis, edema or deformities noted Neurological: Alert oriented x 4, grossly nonfocal Psychological:  Alert and cooperative. Normal mood and affect  Assessment and Recommendations:  1. GERD. Continue and antireflux measures and omeprazole 40 mg twice daily. Try to reduce to once daily if it is adequate for symptom control.   2. CRC screening, average risk. Colonoscopy recommended at age 38 in March 2017.  3. Constipation. High fiber diet with adequate daily water intake. Miralax qd to bid titrated for adequate bowel movements. Call if symptoms not substantially improved.

## 2015-09-18 NOTE — Patient Instructions (Signed)
We have sent the following medications to your pharmacy for you to pick up at your convenience:omeprazole.  You can take over the counter Miralax mixing 17 grams in 8 oz of water 1-2 x daily.   You will be due for a recall colonoscopy in 01/2016. We will send you a reminder in the mail when it gets closer to that time.  Thank you for choosing me and [facility].  Pricilla Riffle. Dagoberto Ligas., MD., [facility]

## 2015-09-26 ENCOUNTER — Ambulatory Visit: Payer: Commercial Managed Care - HMO | Admitting: Family Medicine

## 2015-10-23 ENCOUNTER — Ambulatory Visit: Payer: Commercial Managed Care - HMO | Admitting: Internal Medicine

## 2015-12-17 DIAGNOSIS — G43109 Migraine with aura, not intractable, without status migrainosus: Secondary | ICD-10-CM | POA: Insufficient documentation

## 2015-12-19 ENCOUNTER — Encounter: Payer: Self-pay | Admitting: Gastroenterology

## 2016-02-07 ENCOUNTER — Encounter (INDEPENDENT_AMBULATORY_CARE_PROVIDER_SITE_OTHER): Payer: Self-pay

## 2016-07-09 ENCOUNTER — Telehealth: Payer: Self-pay

## 2016-07-09 NOTE — Telephone Encounter (Signed)
Patient's wife Tammy called. Sho would like to switch to Dr Carlean Purl since Tammy see's him.  He is NOT unhappy with Dr Fuller Plan they just want the same Dr. Aletha Halim forward for permission.

## 2016-07-09 NOTE — Telephone Encounter (Signed)
Dr Carlean Purl will you accept this patient?  Dr Fuller Plan has approved transfer.  You see his wife Tammy who works in primary care downstairs.

## 2016-07-09 NOTE — Telephone Encounter (Signed)
OK 

## 2016-07-10 NOTE — Telephone Encounter (Signed)
Spoke with wife Tammy and set up appt for 07/17/16 with Dr Carlean Purl.

## 2016-07-10 NOTE — Telephone Encounter (Signed)
ok 

## 2016-07-15 ENCOUNTER — Encounter: Payer: Self-pay | Admitting: Gastroenterology

## 2016-07-17 ENCOUNTER — Other Ambulatory Visit (INDEPENDENT_AMBULATORY_CARE_PROVIDER_SITE_OTHER): Payer: Commercial Managed Care - HMO

## 2016-07-17 ENCOUNTER — Ambulatory Visit (INDEPENDENT_AMBULATORY_CARE_PROVIDER_SITE_OTHER): Payer: Commercial Managed Care - HMO | Admitting: Internal Medicine

## 2016-07-17 ENCOUNTER — Encounter: Payer: Self-pay | Admitting: Internal Medicine

## 2016-07-17 VITALS — BP 126/72 | HR 86 | Ht 68.5 in | Wt 184.0 lb

## 2016-07-17 DIAGNOSIS — R1013 Epigastric pain: Secondary | ICD-10-CM

## 2016-07-17 DIAGNOSIS — K219 Gastro-esophageal reflux disease without esophagitis: Secondary | ICD-10-CM

## 2016-07-17 DIAGNOSIS — Z8619 Personal history of other infectious and parasitic diseases: Secondary | ICD-10-CM | POA: Diagnosis not present

## 2016-07-17 DIAGNOSIS — M549 Dorsalgia, unspecified: Secondary | ICD-10-CM

## 2016-07-17 DIAGNOSIS — R6881 Early satiety: Secondary | ICD-10-CM

## 2016-07-17 LAB — COMPREHENSIVE METABOLIC PANEL
ALBUMIN: 4.7 g/dL (ref 3.5–5.2)
ALT: 25 U/L (ref 0–53)
AST: 18 U/L (ref 0–37)
Alkaline Phosphatase: 57 U/L (ref 39–117)
BILIRUBIN TOTAL: 0.6 mg/dL (ref 0.2–1.2)
BUN: 14 mg/dL (ref 6–23)
CALCIUM: 9.8 mg/dL (ref 8.4–10.5)
CO2: 32 meq/L (ref 19–32)
Chloride: 100 mEq/L (ref 96–112)
Creatinine, Ser: 0.88 mg/dL (ref 0.40–1.50)
GFR: 97.24 mL/min (ref >60.00)
Glucose, Bld: 106 mg/dL — ABNORMAL HIGH (ref 70–99)
Potassium: 4.7 mEq/L (ref 3.5–5.1)
Sodium: 136 mEq/L (ref 135–145)
Total Protein: 8 g/dL (ref 6.0–8.3)

## 2016-07-17 LAB — CBC WITH DIFFERENTIAL/PLATELET
BASOS ABS: 0 10*3/uL (ref 0.0–0.1)
BASOS PCT: 0.5 % (ref 0.0–3.0)
Eosinophils Absolute: 0.2 10*3/uL (ref 0.0–0.7)
Eosinophils Relative: 2.3 % (ref 0.0–5.0)
HEMATOCRIT: 44.1 % (ref 39.0–52.0)
HEMOGLOBIN: 15.6 g/dL (ref 13.0–17.0)
LYMPHS PCT: 15.3 % (ref 12.0–46.0)
Lymphs Abs: 1 10*3/uL (ref 0.7–4.0)
MCHC: 35.3 g/dL (ref 30.0–36.0)
MCV: 88.1 fl (ref 78.0–100.0)
MONOS PCT: 7.6 % (ref 3.0–12.0)
Monocytes Absolute: 0.5 10*3/uL (ref 0.1–1.0)
NEUTROS ABS: 4.9 10*3/uL (ref 1.4–7.7)
Neutrophils Relative %: 74.3 % (ref 43.0–77.0)
PLATELETS: 286 10*3/uL (ref 150.0–400.0)
RBC: 5 Mil/uL (ref 4.22–5.81)
RDW: 12.9 % (ref 11.5–15.5)
WBC: 6.6 10*3/uL (ref 4.0–10.5)

## 2016-07-17 LAB — AMYLASE: AMYLASE: 47 U/L (ref 27–131)

## 2016-07-17 LAB — LIPASE: LIPASE: 28 U/L (ref 11.0–59.0)

## 2016-07-17 NOTE — Progress Notes (Signed)
Assessment & Plan:   1. Abdominal pain, epigastric   2. Mid back pain   3. Gastroesophageal reflux disease, esophagitis presence not specified   4. History of Helicobacter pylori infection   5. Early satiety     Differential diagnosis: This seems most like biliary colic and perhaps symptomatic cholelithiasis to me. It could be some sort of gut dysmotility, reflux related problem. He does still have some bloating and some mild early satiety-like symptoms which suggest possible functional dyspepsia also.  Studies:  CBC,CMET amylase and lipase Abdominal ultrasound  Possible Next steps:  If he has gallstones I would refer to a surgeon to consider cholecystectomy If no gallstones and labs unrevealing need to consider repeating an upper endoscopy versus testing for H. pylori in the stool, would have to hold PPI, versus CT scanning. Consider possible functional dyspepsia treatment as well. Gastric motility testing could be necessary.  He is 50 and has not yet had a screening colonoscopy, not urgent but we'll keep that in mind pending these results.  Subjective:    Patient ID: Gabriel Richards, male    DOB: May 12, 1966, 50 y.o.   MRN: [id number] Chief complaint: Abdominal pain HPI  Very nice 50 year old white man previously seen by Gabriel Richards Plan, transferred to me because I care for his wife, and planing of epigastric pain once or twice a week usually occurring when he is asleep. It awakens him it's fairly intense last for a while drink citrate of magnesia and it seems to pass. The pain is in the epigastrium and radiates to the back there may be mild nausea. He has a background of chronic reflux controlled by twice a day PPI, if he misses a dose he knows it and has heartburn otherwise okay. He also has a history of early satiety symptoms which are persistent, not as bad perhaps as in the past, definitely had improvement after treatment of H. pylori gastritis. That was in 2015 No Known  Allergies Outpatient Medications Prior to Visit  Medication Sig Dispense Refill  . aspirin 81 MG tablet Take 81 mg by mouth daily.    . Cholecalciferol (HM VITAMIN D3) 4000 UNITS CAPS Take by mouth. Once daily    . lisinopril (PRINIVIL,ZESTRIL) 10 MG tablet Take 10 mg by mouth daily.    Marland Kitchen omeprazole (PRILOSEC) 40 MG capsule Take 1 capsule (40 mg total) by mouth 2 (two) times daily. 60 capsule 11  . rosuvastatin (CRESTOR) 10 MG tablet Take 10 mg by mouth daily.     No facility-administered medications prior to visit.    Past Medical History:  Diagnosis Date  . Allergy   . Arthritis   . GERD (gastroesophageal reflux disease)   . H. pylori infection   . Hiatal hernia   . Hypertension    Past Surgical History:  Procedure Laterality Date  . ANKLE SURGERY    . APPENDECTOMY    . UPPER GASTROINTESTINAL ENDOSCOPY  2015   H. pylori gastritis   Social History   Social History  . Marital status: Married    Spouse name: N/A  . Number of children: N/A  . Years of education: N/A   Social History Main Topics  . Smoking status: Former Research scientist (life sciences)  . Smokeless tobacco: Never Used  . Alcohol use No  . Drug use: No  . Sexual activity: Yes    Partners: Female   Other Topics Concern  . None   Social History Narrative   Married to [place], she works in [employer]  primary care [place]   Family History  Problem Relation Age of Onset  . Hyperlipidemia Father   . Heart disease Father   . Hypertension Father   . Diabetes Father   . Hypertension Brother   . Hyperlipidemia Brother   . Early death Neg Hx   . Cancer Neg Hx   . COPD Neg Hx   . Drug abuse Neg Hx   . Alcohol abuse Neg Hx   . Kidney disease Neg Hx   . Stroke Neg Hx   . Colon cancer Neg Hx   . Esophageal cancer Neg Hx   . Rectal cancer Neg Hx   . Stomach cancer Neg Hx        Review of Systems No GU Sxs    Objective:   Physical Exam @BP  126/72   Pulse 86   Ht 5' 8.5" (1.74 m)   Wt 184 lb (83.5 kg)   BMI 27.57 kg/m  @  General:  NAD Eyes:   anicteric Lungs:  clear Heart::  S1S2 no rubs, murmurs or gallops Abdomen:  soft and nontender, BS+ Back:  nontender     Data Reviewed:   Previous GI notes 2015 in 2016 EGD biopsy results. 2014-17 labs in care everywhere from primary care office in [place] Has had some abnormal transaminases in the past off and on not more than 3 times abnormal in February 2017 they were normal.   I appreciate the opportunity to care for this patient.

## 2016-07-17 NOTE — Patient Instructions (Signed)
   Your physician has requested that you go to the basement for the lab work before leaving today.    You have been scheduled for an abdominal ultrasound at [facility] on 07/21/16 at 9:00AM. Please arrive 15 minutes prior to your appointment for registration. Make certain not to have anything to eat or drink 6 hours prior to your appointment. Should you need to reschedule your appointment, please contact radiology at [phone]. This test typically takes about 30 minutes to perform.     I appreciate the opportunity to care for you. Silvano Rusk, MD, [facility]

## 2016-07-17 NOTE — Progress Notes (Signed)
Labs all ok Await ultrasound My Chart message

## 2016-07-21 ENCOUNTER — Encounter: Payer: Self-pay | Admitting: Internal Medicine

## 2016-07-21 ENCOUNTER — Ambulatory Visit (INDEPENDENT_AMBULATORY_CARE_PROVIDER_SITE_OTHER): Payer: Commercial Managed Care - HMO

## 2016-07-21 DIAGNOSIS — R1013 Epigastric pain: Secondary | ICD-10-CM

## 2016-07-21 DIAGNOSIS — D71 Functional disorders of polymorphonuclear neutrophils: Secondary | ICD-10-CM | POA: Diagnosis not present

## 2016-07-21 DIAGNOSIS — K8020 Calculus of gallbladder without cholecystitis without obstruction: Secondary | ICD-10-CM | POA: Diagnosis not present

## 2016-07-21 NOTE — Progress Notes (Signed)
He has gallstones and I think that is what is causing his epigastric pain. Please refer to Dr. Nedra Hai re: suspected symptomatic cholelithiasis

## 2016-07-31 ENCOUNTER — Other Ambulatory Visit: Payer: Self-pay | Admitting: Surgery

## 2016-09-26 ENCOUNTER — Other Ambulatory Visit: Payer: Self-pay

## 2016-09-26 MED ORDER — OMEPRAZOLE 40 MG PO CPDR
40.0000 mg | DELAYED_RELEASE_CAPSULE | Freq: Two times a day (BID) | ORAL | 11 refills | Status: DC
Start: 2016-09-26 — End: 2017-09-30

## 2016-09-26 NOTE — Telephone Encounter (Signed)
Omeprazole refill sent in as requested.

## 2017-01-01 ENCOUNTER — Other Ambulatory Visit: Payer: Self-pay | Admitting: Surgery

## 2017-01-12 ENCOUNTER — Other Ambulatory Visit: Payer: Self-pay | Admitting: Surgery

## 2017-03-19 ENCOUNTER — Encounter: Payer: Self-pay | Admitting: Gastroenterology

## 2017-03-19 DIAGNOSIS — I10 Essential (primary) hypertension: Secondary | ICD-10-CM | POA: Insufficient documentation

## 2017-03-19 DIAGNOSIS — E785 Hyperlipidemia, unspecified: Secondary | ICD-10-CM | POA: Insufficient documentation

## 2017-04-24 ENCOUNTER — Encounter: Payer: Self-pay | Admitting: Internal Medicine

## 2017-04-27 ENCOUNTER — Ambulatory Visit: Payer: Commercial Managed Care - HMO | Admitting: Family Medicine

## 2017-04-28 ENCOUNTER — Encounter: Payer: Self-pay | Admitting: Family Medicine

## 2017-04-28 ENCOUNTER — Ambulatory Visit: Payer: Self-pay

## 2017-04-28 ENCOUNTER — Ambulatory Visit (INDEPENDENT_AMBULATORY_CARE_PROVIDER_SITE_OTHER): Payer: 59 | Admitting: Family Medicine

## 2017-04-28 VITALS — BP 124/82 | HR 80 | Ht 70.0 in

## 2017-04-28 DIAGNOSIS — M79644 Pain in right finger(s): Secondary | ICD-10-CM | POA: Diagnosis not present

## 2017-04-28 DIAGNOSIS — M65351 Trigger finger, right little finger: Secondary | ICD-10-CM | POA: Diagnosis not present

## 2017-04-28 NOTE — Progress Notes (Signed)
[facility] [place], [place] [address] Phone: [phone] Subjective:     CC: Right pinky finger pain  [id number]  Gabriel Richards is a 51 y.o. male coming in with complaint of right pinky finger pain. Patient states that it gets stuck in a flexed position. Was seen a year and a half ago and did have a trigger finger of the index finger previously. Responded very well to injection. States that he feels very similar. Affecting daily activities. Patient does do a lot of manual labor. Denies any swelling. States that in the morning can be severely tender to extended.     Past Medical History:  Diagnosis Date  . Allergy   . Arthritis   . GERD (gastroesophageal reflux disease)   . H. pylori infection   . Hiatal hernia   . Hypertension    Past Surgical History:  Procedure Laterality Date  . ANKLE SURGERY    . APPENDECTOMY    . UPPER GASTROINTESTINAL ENDOSCOPY  2015   H. pylori gastritis   Social History   Social History  . Marital status: Married    Spouse name: N/A  . Number of children: N/A  . Years of education: N/A   Social History Main Topics  . Smoking status: Former Research scientist (life sciences)  . Smokeless tobacco: Never Used  . Alcohol use No  . Drug use: No  . Sexual activity: Yes    Partners: Female   Other Topics Concern  . 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 Hyperlipidemia Father   . Heart disease Father   . Hypertension Father   . Diabetes Father   . Hypertension Brother   . Hyperlipidemia Brother   . Early death Neg Hx   . Cancer Neg Hx   . COPD Neg Hx   . Drug abuse Neg Hx   . Alcohol abuse Neg Hx   . Kidney disease Neg Hx   . Stroke Neg Hx   . Colon cancer Neg Hx   . Esophageal cancer Neg Hx   . Rectal cancer Neg Hx   . Stomach cancer Neg Hx     Past medical history, social,  surgical and family history all reviewed in electronic medical record.  No pertanent information unless stated regarding to the chief complaint.   Review of Systems:Review of systems updated and as accurate as of 04/28/17  No headache, visual changes, nausea, vomiting, diarrhea, constipation, dizziness, abdominal pain, skin rash, fevers, chills, night sweats, weight loss, swollen lymph nodes, body aches, joint swelling, muscle aches, chest pain, shortness of breath, mood changes.   Objective  There were no vitals taken for this visit. Systems examined below as of 04/28/17   General: No apparent distress alert and oriented x3 mood and affect normal, dressed appropriately.  HEENT: Pupils equal, extraocular movements intact  Respiratory: Patient's speak in full sentences and does not appear short of breath  Cardiovascular: No lower extremity edema, non tender, no erythema  Skin: Warm dry intact with no signs of infection or rash on extremities or on axial skeleton.  Abdomen: Soft nontender  Neuro: Cranial nerves II through XII are intact, neurovascularly intact in all extremities with 2+ DTRs and 2+ pulses.  Lymph: No lymphadenopathy of posterior or anterior cervical chain or axillae bilaterally.  Gait normal with good  balance and coordination.  MSK:  Non tender with full range of motion and good stability and symmetric strength and tone of shoulders, elbows, wrist, hip, knee and ankles bilaterally.  Right heel exam shows the patient does have a trigger nodule at the A2 pulley of the pinky finger. Mild triggering noting. Neurovascularly intact distally with good capillary refill  Procedure: Real-time Ultrasound Guided Injection of flexor tendon sheath of the right fifth finger Device: GE Logiq Q7 Ultrasound guided injection is preferred based studies that show increased duration, increased effect, greater accuracy, decreased procedural pain, increased response rate, and decreased cost with  ultrasound guided versus blind injection.  Verbal informed consent obtained.  Time-out conducted.  Noted no overlying erythema, induration, or other signs of local infection.  Skin prepped in a sterile fashion.  Local anesthesia: Topical Ethyl chloride.  With sterile technique and under real time ultrasound guidance:  With a 25-gauge half-inch needle patient was injected with a total of 0.5 mL of 0.5% Marcaine and 0.5 mL of Kenalog 40 mg/dL into the tendon sheath Completed without difficulty  Pain immediately resolved suggesting accurate placement of the medication.  Advised to call if fevers/chills, erythema, induration, drainage, or persistent bleeding.  Images permanently stored and available for review in the ultrasound unit.  Impression: Technically successful ultrasound guided injection.   Impression and Recommendations:     This case required medical decision making of moderate complexity.      Note: This dictation was prepared with Dragon dictation along with smaller phrase technology. Any transcriptional errors that result from this process are unintentional.

## 2017-04-28 NOTE — Patient Instructions (Signed)
Good to see you  We injected the trigger point May take 2 weeks to help  You know where I am if you need me.

## 2017-04-28 NOTE — Assessment & Plan Note (Signed)
Patient given injection today. Discussed potential bracing. Topical anti-inflammatories. Follow-up 4 weeks

## 2017-05-15 ENCOUNTER — Ambulatory Visit (AMBULATORY_SURGERY_CENTER): Payer: Self-pay | Admitting: *Deleted

## 2017-05-15 VITALS — Ht 70.0 in | Wt 192.0 lb

## 2017-05-15 DIAGNOSIS — Z1211 Encounter for screening for malignant neoplasm of colon: Secondary | ICD-10-CM

## 2017-05-15 NOTE — Progress Notes (Signed)
Patient denies any allergies to eggs or soy. Patient denies any problems with anesthesia/sedation. Patient denies any oxygen use at home and does not take any diet/weight loss medications. EMMI education assisgned to patient on colonoscopy, this was explained and instructions given to patient. 

## 2017-05-18 ENCOUNTER — Encounter: Payer: Self-pay | Admitting: Internal Medicine

## 2017-05-27 ENCOUNTER — Encounter: Payer: Commercial Managed Care - HMO | Admitting: Gastroenterology

## 2017-05-28 ENCOUNTER — Encounter: Payer: Self-pay | Admitting: Internal Medicine

## 2017-05-28 ENCOUNTER — Ambulatory Visit (AMBULATORY_SURGERY_CENTER): Payer: 59 | Admitting: Internal Medicine

## 2017-05-28 VITALS — BP 112/70 | HR 75 | Temp 99.3°F | Resp 24 | Ht 70.0 in | Wt 192.0 lb

## 2017-05-28 DIAGNOSIS — Z1211 Encounter for screening for malignant neoplasm of colon: Secondary | ICD-10-CM

## 2017-05-28 DIAGNOSIS — D123 Benign neoplasm of transverse colon: Secondary | ICD-10-CM

## 2017-05-28 DIAGNOSIS — Z1212 Encounter for screening for malignant neoplasm of rectum: Secondary | ICD-10-CM

## 2017-05-28 MED ORDER — SODIUM CHLORIDE 0.9 % IV SOLN: 500.0000 mL | INTRAVENOUS | Status: AC

## 2017-05-28 NOTE — Progress Notes (Signed)
Called to room to assist during endoscopic procedure.  Patient ID and intended procedure confirmed with present staff. Received instructions for my participation in the procedure from the performing physician.  

## 2017-05-28 NOTE — Patient Instructions (Addendum)
   I found and removed one tiny polyp.  I will let you know pathology results and when to have another routine colonoscopy by mail and/or My Chart.  I appreciate the opportunity to care for you. Gatha Mayer, MD, FACG    YOU HAD AN ENDOSCOPIC PROCEDURE TODAY AT [facility]:   Refer to the procedure report that was given to you for any specific questions about what was found during the examination.  If the procedure report does not answer your questions, please call your gastroenterologist to clarify.  If you requested that your care partner not be given the details of your procedure findings, then the procedure report has been included in a sealed envelope for you to review at your convenience later.  YOU SHOULD EXPECT: Some feelings of bloating in the abdomen. Passage of more gas than usual.  Walking can help get rid of the air that was put into your GI tract during the procedure and reduce the bloating. If you had a lower endoscopy (such as a colonoscopy or flexible sigmoidoscopy) you may notice spotting of blood in your stool or on the toilet paper. If you underwent a bowel prep for your procedure, you may not have a normal bowel movement for a few days.  Please Note:  You might notice some irritation and congestion in your nose or some drainage.  This is from the oxygen used during your procedure.  There is no need for concern and it should clear up in a day or so.  SYMPTOMS TO REPORT IMMEDIATELY:   Following lower endoscopy (colonoscopy or flexible sigmoidoscopy):  Excessive amounts of blood in the stool  Significant tenderness or worsening of abdominal pains  Swelling of the abdomen that is new, acute  Fever of 100F or higher    For urgent or emergent issues, a gastroenterologist can be reached at any hour by calling [phone].   DIET:  We do recommend a small meal at first, but then you may proceed to your regular diet.  Drink plenty of fluids but  you should avoid alcoholic beverages for 24 hours.  ACTIVITY:  You should plan to take it easy for the rest of today and you should NOT DRIVE or use heavy machinery until tomorrow (because of the sedation medicines used during the test).    FOLLOW UP: Our staff will call the number listed on your records the next business day following your procedure to check on you and address any questions or concerns that you may have regarding the information given to you following your procedure. If we do not reach you, we will leave a message.  However, if you are feeling well and you are not experiencing any problems, there is no need to return our call.  We will assume that you have returned to your regular daily activities without incident.  If any biopsies were taken you will be contacted by phone or by letter within the next 1-3 weeks.  Please call us at [phone] if you have not heard about the biopsies in 3 weeks.    SIGNATURES/CONFIDENTIALITY: You and/or your care partner have signed paperwork which will be entered into your electronic medical record.  These signatures attest to the fact that that the information above on your After Visit Summary has been reviewed and is understood.  Full responsibility of the confidentiality of this discharge information lies with you and/or your care-partner.   Information on polyps given to you today

## 2017-05-28 NOTE — Op Note (Signed)
[facility] Patient Name: Gabriel Richards Procedure Date: 05/28/2017 3:02 PM MRN: [id number] Endoscopist: Gatha Mayer , MD Age: 51 Referring MD:  Date of Birth: January 15, 1966 Gender: Male Account #: [id number] Procedure:                Colonoscopy Indications:              Screening for colorectal malignant neoplasm, This                            is the patient's first colonoscopy Medicines:                Propofol per Anesthesia, Monitored Anesthesia Care Procedure:                Pre-Anesthesia Assessment:                           - Prior to the procedure, a History and Physical                            was performed, and patient medications and                            allergies were reviewed. The patient's tolerance of                            previous anesthesia was also reviewed. The risks                            and benefits of the procedure and the sedation                            options and risks were discussed with the patient.                            All questions were answered, and informed consent                            was obtained. Prior Anticoagulants: The patient has                            taken no previous anticoagulant or antiplatelet                            agents. ASA Grade Assessment: II - A patient with                            mild systemic disease. After reviewing the risks                            and benefits, the patient was deemed in                            satisfactory condition to undergo the procedure.  After obtaining informed consent, the colonoscope                            was passed under direct vision. Throughout the                            procedure, the patient's blood pressure, pulse, and                            oxygen saturations were monitored continuously. The                            Colonoscope was introduced through the anus and   advanced to the the cecum, identified by                            appendiceal orifice and ileocecal valve. The                            colonoscopy was performed without difficulty. The                            patient tolerated the procedure well. The quality                            of the bowel preparation was excellent. The bowel                            preparation used was Miralax. The ileocecal valve,                            appendiceal orifice, and rectum were photographed. Scope In: 3:04:04 PM Scope Out: 3:14:20 PM Scope Withdrawal Time: 0 hours 8 minutes 21 seconds  Total Procedure Duration: 0 hours 10 minutes 16 seconds  Findings:                 The perianal and digital rectal examinations were                            normal.                           A diminutive polyp was found in the transverse                            colon. The polyp was sessile. The polyp was removed                            with a cold snare. Resection and retrieval were                            complete. Verification of patient identification                            for the specimen was  done. Estimated blood loss was                            minimal.                           The exam was otherwise without abnormality on                            direct and retroflexion views. Complications:            No immediate complications. Estimated Blood Loss:     Estimated blood loss was minimal. Impression:               - One diminutive polyp in the transverse colon,                            removed with a cold snare. Resected and retrieved.                           - The examination was otherwise normal on direct                            and retroflexion views. Recommendation:           - Patient has a contact number available for                            emergencies. The signs and symptoms of potential                            delayed complications were discussed with  the                            patient. Return to normal activities tomorrow.                            Written discharge instructions were provided to the                            patient.                           - Resume previous diet.                           - Continue present medications.                           - Repeat colonoscopy is recommended. The                            colonoscopy date will be determined after pathology                            results from today's exam become available for  review. Gatha Mayer, MD 05/28/2017 3:18:51 PM This report has been signed electronically.

## 2017-05-28 NOTE — Progress Notes (Signed)
Report to PACU, RN, vss, BBS= Clear.  

## 2017-05-28 NOTE — Progress Notes (Signed)
Pt's states no medical or surgical changes since previsit or office visit. 

## 2017-05-29 ENCOUNTER — Telehealth: Payer: Self-pay

## 2017-05-29 NOTE — Telephone Encounter (Signed)
Called [phone] and left a messaged we tried to reach pt for a follow up call. maw

## 2017-05-29 NOTE — Telephone Encounter (Signed)
Called [phone] and unable to leave a  Message. Will  try to reach pt for a follow up call. maw

## 2017-06-03 ENCOUNTER — Encounter: Payer: Self-pay | Admitting: Internal Medicine

## 2017-06-03 DIAGNOSIS — Z860101 Personal history of adenomatous and serrated colon polyps: Secondary | ICD-10-CM

## 2017-06-03 DIAGNOSIS — Z86010 Personal history of colonic polyps: Secondary | ICD-10-CM

## 2017-06-03 HISTORY — DX: Personal history of adenomatous and serrated colon polyps: Z86.0101

## 2017-06-03 HISTORY — DX: Personal history of colonic polyps: Z86.010

## 2017-06-03 NOTE — Progress Notes (Signed)
Diminutive adenoma Recall 2023 My Chart letter

## 2017-09-30 ENCOUNTER — Other Ambulatory Visit: Payer: Self-pay | Admitting: Internal Medicine

## 2018-02-03 NOTE — Progress Notes (Signed)
[facility] [address] [place], [place] [address] Phone: [phone] Subjective:    I'm seeing this patient by the request  of:    CC: Elbow pain  [id number]  Gabriel Richards is a 52 y.o. male coming in with complaint of right arm pain and right pinky finger triggering. His pain has been there for a few weeks and is on the lateral aspect. He has trouble lifting his coffee cup due to the pain. He has a lot of pain in the morning and then in the evening he experiences popping in the elbow. He also notes some swelling in the right elbow.   Onset- 1 month ago Location- lateral right elbow Duration- intermittent Character- popping Aggravating factors- elbow flexion Reliving factors-  Therapies tried-  Severity-7 out of 10 with the elbow, 3 out of 10 with the pinky     Past Medical History:  Diagnosis Date  . Allergy   . Arthritis   . GERD (gastroesophageal reflux disease)   . H. pylori infection   . Hiatal hernia   . Hx of adenomatous polyp of colon 06/03/2017  . Hyperlipidemia   . Hypertension    Past Surgical History:  Procedure Laterality Date  . ANKLE SURGERY    . APPENDECTOMY    . CHOLECYSTECTOMY    . UPPER GASTROINTESTINAL ENDOSCOPY  2015   H. pylori gastritis   Social History   Socioeconomic History  . Marital status: Married    Spouse name: Not on file  . Number of children: Not on file  . Years of education: Not on file  . Highest education level: Not on file  Occupational History  . Not on file  Social Needs  . Financial resource strain: Not on file  . Food insecurity:    Worry: Not on file    Inability: Not on file  . Transportation needs:    Medical: Not on file    Non-medical: Not on file  Tobacco Use  . Smoking status: Former Research scientist (life sciences)  . Smokeless tobacco: Never Used  Substance and Sexual Activity  . Alcohol use: No  . Drug use: No  . Sexual activity: Yes    Partners: Female  Lifestyle  . Physical activity:      Days per week: Not on file    Minutes per session: Not on file  . Stress: Not on file  Relationships  . Social connections:    Talks on phone: Not on file    Gets together: Not on file    Attends religious service: Not on file    Active member of club or organization: Not on file    Attends meetings of clubs or organizations: Not on file    Relationship status: Not on file  Other Topics Concern  . Not on file  Social History Narrative   Married to [place], she works in [employer]   No Known Allergies Family History  Problem Relation Age of Onset  . Hyperlipidemia Father   . Heart disease Father   . Hypertension Father   . Diabetes Father   . Hypertension Brother   . Hyperlipidemia Brother   . Early death Neg Hx   . Cancer Neg Hx   . COPD Neg Hx   . Drug abuse Neg Hx   . Alcohol abuse Neg Hx   . Kidney disease Neg Hx   . Stroke Neg Hx   . Colon cancer Neg Hx   .  Esophageal cancer Neg Hx   . Rectal cancer Neg Hx   . Stomach cancer Neg Hx      Past medical history, social, surgical and family history all reviewed in electronic medical record.  No pertanent information unless stated regarding to the chief complaint.   Review of Systems:Review of systems updated and as accurate as of 02/03/18  No headache, visual changes, nausea, vomiting, diarrhea, constipation, dizziness, abdominal pain, skin rash, fevers, chills, night sweats, weight loss, swollen lymph nodes, body aches, joint swelling, muscle aches, chest pain, shortness of breath, mood changes.   Objective  There were no vitals taken for this visit. Systems examined below as of 02/03/18   General: No apparent distress alert and oriented x3 mood and affect normal, dressed appropriately.  HEENT: Pupils equal, extraocular movements intact  Respiratory: Patient's speak in full sentences and does not appear short of breath  Cardiovascular: No lower extremity edema, non tender, no erythema  Skin: Warm  dry intact with no signs of infection or rash on extremities or on axial skeleton.  Abdomen: Soft nontender  Neuro: Cranial nerves II through XII are intact, neurovascularly intact in all extremities with 2+ DTRs and 2+ pulses.  Lymph: No lymphadenopathy of posterior or anterior cervical chain or axillae bilaterally.  Gait normal with good balance and coordination.  MSK:  Non tender with full range of motion and good stability and symmetric strength and tone of shoulders, wrist, hip, knee and ankles bilaterally.  Elbow: Right Unremarkable to inspection. Range of motion full pronation, supination, flexion, extension. Strength is full to all of the above directions Stable to varus, valgus stress. Negative moving valgus stress test. Lateral epic areas of tenderness to palpation. Ulnar nerve does not sublux. Negative cubital tunnel Tinel's. Contralateral elbow unremarkable  Procedure: Real-time Ultrasound Guided Injection of right sided fifth flexor tendon sheath Device: GE Logiq Q7 Ultrasound guided injection is preferred based studies that show increased duration, increased effect, greater accuracy, decreased procedural pain, increased response rate, and decreased cost with ultrasound guided versus blind injection.  Verbal informed consent obtained.  Time-out conducted.  Noted no overlying erythema, induration, or other signs of local infection.  Skin prepped in a sterile fashion.  Local anesthesia: Topical Ethyl chloride.  With sterile technique and under real time ultrasound guidance: 25-gauge half inch needle patient was injected with a total of 0.5 cc of 0.5% Marcaine and 0.5 cc of Kenalog 40 mg/mL Completed without difficulty  Pain immediately resolved suggesting accurate placement of the medication.  Advised to call if fevers/chills, erythema, induration, drainage, or persistent bleeding.  Images permanently stored and available for review in the ultrasound unit.  Impression:  Technically successful ultrasound guided injection.  Musculoskeletal ultrasound was performed and interpreted by Charlann Boxer D.O.   Elbow:  Lateral epicondyle and common extensor tendon origin visualized.  With tearing, increased Doppler flow, as well as hypoechoic changes.  Minimal retraction noted. Olecranon and triceps insertion visualized and unremarkable without edema, effusion, or avulsion.  No signs olecranon bursitis.  IMPRESSION: Lateral epicondylitis with partial tearing of the common extensor tendon   Impression and Recommendations:     This case required medical decision making of moderate complexity.      Note: This dictation was prepared with Dragon dictation along with smaller phrase technology. Any transcriptional errors that result from this process are unintentional.

## 2018-02-04 ENCOUNTER — Encounter: Payer: Self-pay | Admitting: Family Medicine

## 2018-02-04 ENCOUNTER — Ambulatory Visit: Payer: Self-pay

## 2018-02-04 ENCOUNTER — Ambulatory Visit: Payer: 59 | Admitting: Family Medicine

## 2018-02-04 VITALS — BP 138/90 | HR 75 | Ht 70.0 in | Wt 198.0 lb

## 2018-02-04 DIAGNOSIS — M7711 Lateral epicondylitis, right elbow: Secondary | ICD-10-CM | POA: Diagnosis not present

## 2018-02-04 DIAGNOSIS — M25521 Pain in right elbow: Secondary | ICD-10-CM

## 2018-02-04 DIAGNOSIS — M65351 Trigger finger, right little finger: Secondary | ICD-10-CM | POA: Diagnosis not present

## 2018-02-04 NOTE — Patient Instructions (Signed)
Good to see you  Ice 20 minutes 2 times daily. Usually after activity and before bed. pennsaid pinkie amount topically 2 times daily as needed.   Wear wrist brace day and night for 1 week then at night for 2 weeks Wear the other brace with work  Lift underhand or thumbs up  See me again in 4 weeks

## 2018-02-04 NOTE — Assessment & Plan Note (Signed)
Lateral Epicondylitis: Elbow anatomy was reviewed, and tendinopathy was explained.  Pt. given a formal rehab program. Series of concentric and eccentric exercises should be done starting with no weight, work up to 1 lb, hammer, etc.  Use counterforce strap if working or using hands.  Formal PT would be beneficial. Emphasized stretching an cross-friction massage Emphasized proper palms up lifting biomechanics to unload ECRB RTC in 4 weeks  

## 2018-02-04 NOTE — Assessment & Plan Note (Signed)
Patient had repeat injection.  Follow-up again 4 weeks

## 2019-01-21 ENCOUNTER — Other Ambulatory Visit: Payer: Self-pay | Admitting: Internal Medicine

## 2019-01-27 ENCOUNTER — Other Ambulatory Visit: Payer: Self-pay

## 2019-01-27 ENCOUNTER — Ambulatory Visit (INDEPENDENT_AMBULATORY_CARE_PROVIDER_SITE_OTHER)
Admission: RE | Admit: 2019-01-27 | Discharge: 2019-01-27 | Disposition: A | Discharge status: Home / Self Care | Payer: 59 | Source: Ambulatory Visit | Attending: Family Medicine | Admitting: Family Medicine

## 2019-01-27 ENCOUNTER — Ambulatory Visit: Payer: 59 | Admitting: Family Medicine

## 2019-01-27 ENCOUNTER — Ambulatory Visit: Payer: Self-pay

## 2019-01-27 ENCOUNTER — Encounter: Payer: Self-pay | Admitting: Family Medicine

## 2019-01-27 VITALS — BP 128/82 | HR 85 | Ht 70.0 in | Wt 198.0 lb

## 2019-01-27 DIAGNOSIS — M79644 Pain in right finger(s): Secondary | ICD-10-CM | POA: Diagnosis not present

## 2019-01-27 DIAGNOSIS — G8929 Other chronic pain: Secondary | ICD-10-CM

## 2019-01-27 DIAGNOSIS — S66801A Unspecified injury of other specified muscles, fascia and tendons at wrist and hand level, right hand, initial encounter: Secondary | ICD-10-CM | POA: Diagnosis not present

## 2019-01-27 NOTE — Patient Instructions (Signed)
Good to see you  Gabriel Richards is your friend.zice Wear the brace day and night for 2 weeks  Then nightly for 2 weeks.  Xrays downstairs Thumb was out of joint and concern for a ligamnet we will nee dto watch  See me again in 3 weeks

## 2019-01-27 NOTE — Progress Notes (Signed)
[facility] [address], [place] [address] Phone: [phone] Subjective:   I Kandace Blitz am serving as a Education administrator for Dr. Hulan Saas.     CC: Right thumb pain  DVV:[id number]     Updated 01/27/2019 Omer Puccinelli is a 53 y.o. male coming in with complaint of right thumb pain. States that he was repairing a bath tub when he fell forward when he hyperextended his thumb. States that he feels a knot on the MCP joint. Loss of ROM.   Onset-1 month ago Location- Thumb   Character- sharp throbbing  Aggravating factors-  Reliving factors-  Therapies tried- ice Severity-9 out of 10     Past Medical History:  Diagnosis Date  . Allergy   . Arthritis   . GERD (gastroesophageal reflux disease)   . H. pylori infection   . Hiatal hernia   . Hx of adenomatous polyp of colon 06/03/2017  . Hyperlipidemia   . Hypertension    Past Surgical History:  Procedure Laterality Date  . ANKLE SURGERY    . APPENDECTOMY    . CHOLECYSTECTOMY    . UPPER GASTROINTESTINAL ENDOSCOPY  2015   H. pylori gastritis   Social History   Socioeconomic History  . Marital status: Married    Spouse name: Not on file  . Number of children: Not on file  . Years of education: Not on file  . Highest education level: Not on file  Occupational History  . Not on file  Social Needs  . Financial resource strain: Not on file  . Food insecurity:    Worry: Not on file    Inability: Not on file  . Transportation needs:    Medical: Not on file    Non-medical: Not on file  Tobacco Use  . Smoking status: Former Research scientist (life sciences)  . Smokeless tobacco: Never Used  Substance and Sexual Activity  . Alcohol use: No  . Drug use: No  . Sexual activity: Yes    Partners: Female  Lifestyle  . Physical activity:    Days per week: Not on file    Minutes per session: Not on file  . Stress: Not on file  Relationships  . Social connections:    Talks on phone: Not on file    Gets together:  Not on file    Attends religious service: Not on file    Active member of club or organization: Not on file    Attends meetings of clubs or organizations: Not on file    Relationship status: Not on file  Other Topics Concern  . Not on file  Social History Narrative   Married to [place], she works in [employer]   No Known Allergies Family History  Problem Relation Age of Onset  . Hyperlipidemia Father   . Heart disease Father   . Hypertension Father   . Diabetes Father   . Hypertension Brother   . Hyperlipidemia Brother   . Early death Neg Hx   . Cancer Neg Hx   . COPD Neg Hx   . Drug abuse Neg Hx   . Alcohol abuse Neg Hx   . Kidney disease Neg Hx   . Stroke Neg Hx   . Colon cancer Neg Hx   . Esophageal cancer Neg Hx   . Rectal cancer Neg Hx   . Stomach cancer Neg Hx      Current Outpatient Medications (Cardiovascular):  .  lisinopril (PRINIVIL,ZESTRIL) 10  MG tablet, Take 10 mg by mouth daily. .  rosuvastatin (CRESTOR) 10 MG tablet, Take 10 mg by mouth daily.   Current Outpatient Medications (Analgesics):  .  aspirin 81 MG tablet, Take 81 mg by mouth daily.   Current Outpatient Medications (Other):  Marland Kitchen  Cholecalciferol (HM VITAMIN D3) 4000 UNITS CAPS, Take by mouth. Once daily .  omeprazole (PRILOSEC) 40 MG capsule, TAKE 1 CAPSULE BY MOUTH TWICE DAILY    Past medical history, social, surgical and family history all reviewed in electronic medical record.  No pertanent information unless stated regarding to the chief complaint.   Review of Systems:  No headache, visual changes, nausea, vomiting, diarrhea, constipation, dizziness, abdominal pain, skin rash, fevers, chills, night sweats, weight loss, swollen lymph nodes, body aches, joint swelling, , chest pain, shortness of breath, mood changes.  Positive muscle aches  Objective  Blood pressure 128/82, pulse 85, height 5\' 10"  (1.778 m), weight 198 lb (89.8 kg), SpO2 96 %.   General: No apparent distress  alert and oriented x3 mood and affect normal, dressed appropriately.  HEENT: Pupils equal, extraocular movements intact  Respiratory: Patient's speak in full sentences and does not appear short of breath  Cardiovascular: No lower extremity edema, non tender, no erythema  Skin: Warm dry intact with no signs of infection or rash on extremities or on axial skeleton.  Abdomen: Soft nontender  Neuro: Cranial nerves II through XII are intact, neurovascularly intact in all extremities with 2+ DTRs and 2+ pulses.  Lymph: No lymphadenopathy of posterior or anterior cervical chain or axillae bilaterally.  Gait normal with good balance and coordination.  MSK:  Non tender with full range of motion and good stability and symmetric strength and tone of shoulders, elbows, hip, knee and ankles bilaterally.  Mild arthritic changes Wrist: right  Inspection abnormality noted in the [facility] ROM smooth and normal with good flexion and extension and ulnar/radial deviation that is symmetrical with opposite wrist. Mild pain over the [facility] joint does have what appears to be a chronic subluxation.  UCL does have some gapping noted No snuffbox tenderness. No tenderness over Canal [place]. Strength 5/5 in all directions without pain. Negative Finkelstein, tinel's and phalens. Negative Watson's test. Contralateral wrist unremarkable  Limited musculoskeletal ultrasound was performed to interpreted by Lyndal Pulley  Limited ultrasound of the [facility] joint shows possible subluxation noted.  Also possible small avulsion fracture.  UCL does show some gapping.  Difficult to assess if it is full-thickness. Impression: CMC arthritic changes with UCL tear    Impression and Recommendations:     This case required medical decision making of moderate complexity. The above documentation has been reviewed and is accurate and complete Lyndal Pulley, DO       Note: This dictation was prepared with Dragon dictation along with  smaller phrase technology. Any transcriptional errors that result from this process are unintentional.

## 2019-01-27 NOTE — Assessment & Plan Note (Signed)
Patient has more of an injury in the UCL.  Concern for possible fall rupture.  Patient did have subluxation of the [facility] joint as well as some underlying arthritic changes.  Trigger nodule noted as well.  This will be somewhat difficult.  Brace given, home exercise, icing regimen.  Patient will follow-up with me again 4 weeks

## 2019-02-23 NOTE — Progress Notes (Deleted)
[facility] [address] [place], [place] [address] Phone: [phone] Subjective:    I'm seeing this patient by the request  of:    CC:   [id number]  Gabriel Richards is a 53 y.o. male coming in with complaint of ***  Onset-  Location Duration-  Character- Aggravating factors- Reliving factors-  Therapies tried-  Severity-     Past Medical History:  Diagnosis Date  . Allergy   . Arthritis   . GERD (gastroesophageal reflux disease)   . H. pylori infection   . Hiatal hernia   . Hx of adenomatous polyp of colon 06/03/2017  . Hyperlipidemia   . Hypertension    Past Surgical History:  Procedure Laterality Date  . ANKLE SURGERY    . APPENDECTOMY    . CHOLECYSTECTOMY    . UPPER GASTROINTESTINAL ENDOSCOPY  2015   H. pylori gastritis   Social History   Socioeconomic History  . Marital status: Married    Spouse name: Not on file  . Number of children: Not on file  . Years of education: Not on file  . Highest education level: Not on file  Occupational History  . Not on file  Social Needs  . Financial resource strain: Not on file  . Food insecurity:    Worry: Not on file    Inability: Not on file  . Transportation needs:    Medical: Not on file    Non-medical: Not on file  Tobacco Use  . Smoking status: Former Research scientist (life sciences)  . Smokeless tobacco: Never Used  Substance and Sexual Activity  . Alcohol use: No  . Drug use: No  . Sexual activity: Yes    Partners: Female  Lifestyle  . Physical activity:    Days per week: Not on file    Minutes per session: Not on file  . Stress: Not on file  Relationships  . Social connections:    Talks on phone: Not on file    Gets together: Not on file    Attends religious service: Not on file    Active member of club or organization: Not on file    Attends meetings of clubs or organizations: Not on file    Relationship status: Not on file  Other Topics Concern  . Not on file  Social History  Narrative   Married to [place], she works in [employer]   No Known Allergies Family History  Problem Relation Age of Onset  . Hyperlipidemia Father   . Heart disease Father   . Hypertension Father   . Diabetes Father   . Hypertension Brother   . Hyperlipidemia Brother   . Early death Neg Hx   . Cancer Neg Hx   . COPD Neg Hx   . Drug abuse Neg Hx   . Alcohol abuse Neg Hx   . Kidney disease Neg Hx   . Stroke Neg Hx   . Colon cancer Neg Hx   . Esophageal cancer Neg Hx   . Rectal cancer Neg Hx   . Stomach cancer Neg Hx      Current Outpatient Medications (Cardiovascular):  .  lisinopril (PRINIVIL,ZESTRIL) 10 MG tablet, Take 10 mg by mouth daily. .  rosuvastatin (CRESTOR) 10 MG tablet, Take 10 mg by mouth daily.   Current Outpatient Medications (Analgesics):  .  aspirin 81 MG tablet, Take 81 mg by mouth daily.   Current Outpatient Medications (Other):  Marland Kitchen  Cholecalciferol (HM VITAMIN  D3) 4000 UNITS CAPS, Take by mouth. Once daily .  omeprazole (PRILOSEC) 40 MG capsule, TAKE 1 CAPSULE BY MOUTH TWICE DAILY    Past medical history, social, surgical and family history all reviewed in electronic medical record.  No pertanent information unless stated regarding to the chief complaint.   Review of Systems:  No headache, visual changes, nausea, vomiting, diarrhea, constipation, dizziness, abdominal pain, skin rash, fevers, chills, night sweats, weight loss, swollen lymph nodes, body aches, joint swelling, muscle aches, chest pain, shortness of breath, mood changes.   Objective  There were no vitals taken for this visit. Systems examined below as of    General: No apparent distress alert and oriented x3 mood and affect normal, dressed appropriately.  HEENT: Pupils equal, extraocular movements intact  Respiratory: Patient's speak in full sentences and does not appear short of breath  Cardiovascular: No lower extremity edema, non tender, no erythema  Skin: Warm dry  intact with no signs of infection or rash on extremities or on axial skeleton.  Abdomen: Soft nontender  Neuro: Cranial nerves II through XII are intact, neurovascularly intact in all extremities with 2+ DTRs and 2+ pulses.  Lymph: No lymphadenopathy of posterior or anterior cervical chain or axillae bilaterally.  Gait normal with good balance and coordination.  MSK:  Non tender with full range of motion and good stability and symmetric strength and tone of shoulders, elbows, wrist, hip, knee and ankles bilaterally.     Impression and Recommendations:     This case required medical decision making of moderate complexity. The above documentation has been reviewed and is accurate and complete Lyndal Pulley, DO       Note: This dictation was prepared with Dragon dictation along with smaller phrase technology. Any transcriptional errors that result from this process are unintentional.

## 2019-02-24 ENCOUNTER — Ambulatory Visit: Payer: 59 | Admitting: Family Medicine

## 2019-03-10 ENCOUNTER — Ambulatory Visit: Payer: Self-pay

## 2019-03-10 ENCOUNTER — Encounter: Payer: Self-pay | Admitting: Family Medicine

## 2019-03-10 ENCOUNTER — Ambulatory Visit: Payer: 59 | Admitting: Family Medicine

## 2019-03-10 ENCOUNTER — Other Ambulatory Visit: Payer: Self-pay

## 2019-03-10 VITALS — BP 110/78 | HR 87 | Ht 70.0 in | Wt 198.0 lb

## 2019-03-10 DIAGNOSIS — M6530 Trigger finger, unspecified finger: Secondary | ICD-10-CM | POA: Diagnosis not present

## 2019-03-10 DIAGNOSIS — M79644 Pain in right finger(s): Secondary | ICD-10-CM

## 2019-03-10 DIAGNOSIS — G8929 Other chronic pain: Secondary | ICD-10-CM

## 2019-03-10 DIAGNOSIS — S66801D Unspecified injury of other specified muscles, fascia and tendons at wrist and hand level, right hand, subsequent encounter: Secondary | ICD-10-CM

## 2019-03-10 NOTE — Assessment & Plan Note (Signed)
Patient given injection today.  Tolerated the procedure well.  Discussed icing regimen.  I do believe that this will be significantly helpful with the range of motion of the thumb.  Will take 2 weeks to make significant improvement but patient will be doing bracing for the UCL injury.  Follow-up again in 4 to 6 weeks

## 2019-03-10 NOTE — Progress Notes (Signed)
[facility] [address] [place], [place] [address] Phone: [phone] Subjective:    I'm seeing this patient by the request  of:    CC: Thumb pain follow-up  [id number]   01/27/2019: Patient has more of an injury in the [name].  Concern for possible fall rupture.  Patient did have subluxation of the [facility] joint as well as some underlying arthritic changes.  Trigger nodule noted as well.  This will be somewhat difficult.  Brace given, home exercise, icing regimen.  Patient will follow-up with me again 4 weeks  Update 03/10/2019: Gabriel Richards is a 53 y.o. male coming in with complaint of right thumb and wrist pain. Patient states that he has been having a popping sensation occur at night in the IP joint of his thumb. Pain with certain movements and does have catching sensation. Has been wearing brace but takes it off occasionally during work. May have caused another injury during work without his brace on patient states.  Patient was seen previously and did have a UCL injury.  Seems to be making some progress.  More pain noted of the finger itself and still feels like having difficulty straightening it.     Past Medical History:  Diagnosis Date  . Allergy   . Arthritis   . GERD (gastroesophageal reflux disease)   . H. pylori infection   . Hiatal hernia   . Hx of adenomatous polyp of colon 06/03/2017  . Hyperlipidemia   . Hypertension    Past Surgical History:  Procedure Laterality Date  . ANKLE SURGERY    . APPENDECTOMY    . CHOLECYSTECTOMY    . UPPER GASTROINTESTINAL ENDOSCOPY  2015   H. pylori gastritis   Social History   Socioeconomic History  . Marital status: Married    Spouse name: Not on file  . Number of children: Not on file  . Years of education: Not on file  . Highest education level: Not on file  Occupational History  . Not on file  Social Needs  . Financial resource strain: Not on file  . Food insecurity:    Worry: Not on file     Inability: Not on file  . Transportation needs:    Medical: Not on file    Non-medical: Not on file  Tobacco Use  . Smoking status: Former Research scientist (life sciences)  . Smokeless tobacco: Never Used  Substance and Sexual Activity  . Alcohol use: No  . Drug use: No  . Sexual activity: Yes    Partners: Female  Lifestyle  . Physical activity:    Days per week: Not on file    Minutes per session: Not on file  . Stress: Not on file  Relationships  . Social connections:    Talks on phone: Not on file    Gets together: Not on file    Attends religious service: Not on file    Active member of club or organization: Not on file    Attends meetings of clubs or organizations: Not on file    Relationship status: Not on file  Other Topics Concern  . Not on file  Social History Narrative   Married to [name], she works in [employer]   No Known Allergies Family History  Problem Relation Age of Onset  . Hyperlipidemia Father   . Heart disease Father   . Hypertension Father   . Diabetes Father   . Hypertension Brother   . Hyperlipidemia Brother   .  Early death Neg Hx   . Cancer Neg Hx   . COPD Neg Hx   . Drug abuse Neg Hx   . Alcohol abuse Neg Hx   . Kidney disease Neg Hx   . Stroke Neg Hx   . Colon cancer Neg Hx   . Esophageal cancer Neg Hx   . Rectal cancer Neg Hx   . Stomach cancer Neg Hx      Current Outpatient Medications (Cardiovascular):  .  lisinopril (PRINIVIL,ZESTRIL) 10 MG tablet, Take 10 mg by mouth daily. .  rosuvastatin (CRESTOR) 10 MG tablet, Take 10 mg by mouth daily.   Current Outpatient Medications (Analgesics):  .  aspirin 81 MG tablet, Take 81 mg by mouth daily.   Current Outpatient Medications (Other):  Marland Kitchen  Cholecalciferol (HM VITAMIN D3) 4000 UNITS CAPS, Take by mouth. Once daily .  omeprazole (PRILOSEC) 40 MG capsule, TAKE 1 CAPSULE BY MOUTH TWICE DAILY    Past medical history, social, surgical and family history all reviewed in electronic medical  record.  No pertanent information unless stated regarding to the chief complaint.   Review of Systems:  No headache, visual changes, nausea, vomiting, diarrhea, constipation, dizziness, abdominal pain, skin rash, fevers, chills, night sweats, weight loss, swollen lymph nodes, body aches, joint swelling, muscle aches, chest pain, shortness of breath, mood changes.   Objective  Blood pressure 110/78, pulse 87, height 5\' 10"  (1.778 m), weight 198 lb (89.8 kg), SpO2 94 %.    General: No apparent distress alert and oriented x3 mood and affect normal, dressed appropriately.  HEENT: Pupils equal, extraocular movements intact  Respiratory: Patient's speak in full sentences and does not appear short of breath  Cardiovascular: No lower extremity edema, non tender, no erythema  Skin: Warm dry intact with no signs of infection or rash on extremities or on axial skeleton.  Abdomen: Soft nontender  Neuro: Cranial nerves II through XII are intact, neurovascularly intact in all extremities with 2+ DTRs and 2+ pulses.  Lymph: No lymphadenopathy of posterior or anterior cervical chain or axillae bilaterally.  Gait normal with good balance and coordination.  MSK:  Non tender with full range of motion and good stability and symmetric strength and tone of shoulders, elbows, wrist, hip, knee and ankles bilaterally.  Right thumb shows the patient does have some arthritic changes still noted of the [facility].  Patient does have tightness of the flexor tendon.  Trigger nodule noted.  UCL is improved and does have a positive endpoint but still tenderness with dynamic testing.  Neurovascular intact distally.   Procedure: Real-time Ultrasound Guided Injection of right trigger nodule tendon sheath Device: GE Logiq Q7 Ultrasound guided injection is preferred based studies that show increased duration, increased effect, greater accuracy, decreased procedural pain, increased response rate, and decreased cost with ultrasound  guided versus blind injection.  Verbal informed consent obtained.  Time-out conducted.  Noted no overlying erythema, induration, or other signs of local infection.  Skin prepped in a sterile fashion.  Local anesthesia: Topical Ethyl chloride.  With sterile technique and under real time ultrasound guidance: With a 25-gauge half inch needle injected with 0.5 cc of 0.5% Marcaine and 0.5 cc of Kenalog 40 mg/mL Completed without difficulty  Pain immediately resolved suggesting accurate placement of the medication.  Advised to call if fevers/chills, erythema, induration, drainage, or persistent bleeding.  Images permanently stored and available for review in the ultrasound unit.  Impression: Technically successful ultrasound guided injection.   Impression and Recommendations:  This case required medical decision making of moderate complexity. The above documentation has been reviewed and is accurate and complete Lyndal Pulley, DO       Note: This dictation was prepared with Dragon dictation along with smaller phrase technology. Any transcriptional errors that result from this process are unintentional.

## 2019-03-10 NOTE — Assessment & Plan Note (Signed)
Improvement noted.  Discussed continuing to wear the brace more regularly for the next 3 weeks and then straight to increase range of motion.  Patient is in agreement with the plan.  Follow-up again 4 to 6 weeks

## 2019-03-10 NOTE — Patient Instructions (Addendum)
Good ot see yo u Ice is your friend Continue the brace regularly for next 2 weeks and then can wear less and less  injectied the tendon and really hope it helps a lot  See me one more time in 4 weeks

## 2019-04-06 NOTE — Progress Notes (Deleted)
[facility] [address], [place] [address] Phone: [phone] Subjective:    I'm seeing this patient by the request  of:    CC: Thumb and finger pain follow-up  [id number]  Gabriel Richards is a 53 y.o. male coming in with complaint of ***  Onset-  Location Duration-  Character- Aggravating factors- Reliving factors-  Therapies tried-  Severity-     Past Medical History:  Diagnosis Date  . Allergy   . Arthritis   . GERD (gastroesophageal reflux disease)   . H. pylori infection   . Hiatal hernia   . Hx of adenomatous polyp of colon 06/03/2017  . Hyperlipidemia   . Hypertension    Past Surgical History:  Procedure Laterality Date  . ANKLE SURGERY    . APPENDECTOMY    . CHOLECYSTECTOMY    . UPPER GASTROINTESTINAL ENDOSCOPY  2015   H. pylori gastritis   Social History   Socioeconomic History  . Marital status: Married    Spouse name: Not on file  . Number of children: Not on file  . Years of education: Not on file  . Highest education level: Not on file  Occupational History  . Not on file  Social Needs  . Financial resource strain: Not on file  . Food insecurity:    Worry: Not on file    Inability: Not on file  . Transportation needs:    Medical: Not on file    Non-medical: Not on file  Tobacco Use  . Smoking status: Former Research scientist (life sciences)  . Smokeless tobacco: Never Used  Substance and Sexual Activity  . Alcohol use: No  . Drug use: No  . Sexual activity: Yes    Partners: Female  Lifestyle  . Physical activity:    Days per week: Not on file    Minutes per session: Not on file  . Stress: Not on file  Relationships  . Social connections:    Talks on phone: Not on file    Gets together: Not on file    Attends religious service: Not on file    Active member of club or organization: Not on file    Attends meetings of clubs or organizations: Not on file    Relationship status: Not on file  Other Topics Concern  .  Not on file  Social History Narrative   Married to [name], she works in [employer]   No Known Allergies Family History  Problem Relation Age of Onset  . Hyperlipidemia Father   . Heart disease Father   . Hypertension Father   . Diabetes Father   . Hypertension Brother   . Hyperlipidemia Brother   . Early death Neg Hx   . Cancer Neg Hx   . COPD Neg Hx   . Drug abuse Neg Hx   . Alcohol abuse Neg Hx   . Kidney disease Neg Hx   . Stroke Neg Hx   . Colon cancer Neg Hx   . Esophageal cancer Neg Hx   . Rectal cancer Neg Hx   . Stomach cancer Neg Hx      Current Outpatient Medications (Cardiovascular):  .  lisinopril (PRINIVIL,ZESTRIL) 10 MG tablet, Take 10 mg by mouth daily. .  rosuvastatin (CRESTOR) 10 MG tablet, Take 10 mg by mouth daily.   Current Outpatient Medications (Analgesics):  .  aspirin 81 MG tablet, Take 81 mg by mouth daily.   Current Outpatient Medications (Other):  .  Cholecalciferol (HM VITAMIN D3) 4000 UNITS CAPS, Take by mouth. Once daily .  omeprazole (PRILOSEC) 40 MG capsule, TAKE 1 CAPSULE BY MOUTH TWICE DAILY    Past medical history, social, surgical and family history all reviewed in electronic medical record.  No pertanent information unless stated regarding to the chief complaint.   Review of Systems:  No headache, visual changes, nausea, vomiting, diarrhea, constipation, dizziness, abdominal pain, skin rash, fevers, chills, night sweats, weight loss, swollen lymph nodes, body aches, joint swelling, muscle aches, chest pain, shortness of breath, mood changes.   Objective  There were no vitals taken for this visit. Systems examined below as of    General: No apparent distress alert and oriented x3 mood and affect normal, dressed appropriately.  HEENT: Pupils equal, extraocular movements intact  Respiratory: Patient's speak in full sentences and does not appear short of breath  Cardiovascular: No lower extremity edema, non tender,  no erythema  Skin: Warm dry intact with no signs of infection or rash on extremities or on axial skeleton.  Abdomen: Soft nontender  Neuro: Cranial nerves II through XII are intact, neurovascularly intact in all extremities with 2+ DTRs and 2+ pulses.  Lymph: No lymphadenopathy of posterior or anterior cervical chain or axillae bilaterally.  Gait normal with good balance and coordination.  MSK:  Non tender with full range of motion and good stability and symmetric strength and tone of shoulders, elbows, wrist, hip, knee and ankles bilaterally.     Impression and Recommendations:     This case required medical decision making of moderate complexity. The above documentation has been reviewed and is accurate and complete Lyndal Pulley, DO       Note: This dictation was prepared with Dragon dictation along with smaller phrase technology. Any transcriptional errors that result from this process are unintentional.

## 2019-04-07 ENCOUNTER — Ambulatory Visit: Payer: 59 | Admitting: Family Medicine

## 2019-06-29 ENCOUNTER — Ambulatory Visit (INDEPENDENT_AMBULATORY_CARE_PROVIDER_SITE_OTHER)
Admission: RE | Admit: 2019-06-29 | Discharge: 2019-06-29 | Disposition: A | Discharge status: Home / Self Care | Payer: 59 | Source: Ambulatory Visit | Attending: Family Medicine | Admitting: Family Medicine

## 2019-06-29 ENCOUNTER — Other Ambulatory Visit: Payer: Self-pay

## 2019-06-29 ENCOUNTER — Encounter: Payer: Self-pay | Admitting: Family Medicine

## 2019-06-29 ENCOUNTER — Ambulatory Visit (INDEPENDENT_AMBULATORY_CARE_PROVIDER_SITE_OTHER): Payer: 59 | Admitting: Family Medicine

## 2019-06-29 ENCOUNTER — Other Ambulatory Visit: Payer: Self-pay | Admitting: *Deleted

## 2019-06-29 DIAGNOSIS — M25562 Pain in left knee: Secondary | ICD-10-CM

## 2019-06-29 DIAGNOSIS — S83242A Other tear of medial meniscus, current injury, left knee, initial encounter: Secondary | ICD-10-CM | POA: Diagnosis not present

## 2019-06-29 MED ORDER — MELOXICAM 7.5 MG PO TABS: 7.5000 mg | ORAL_TABLET | Freq: Every day | ORAL | 0 refills | Status: DC

## 2019-06-29 NOTE — Assessment & Plan Note (Signed)
Medial meniscal tear.  Patient has had fairly large which noted on ultrasound.  Images not saved but seems to be 25 to 50% displaced.  Discussed with patient icing regimen, home exercises, hinged brace given today for stability.  Follow-up again in 4 to 6 weeks and will do injection with possible advanced imaging.  Patient knows to call sooner if worsening problems.

## 2019-06-29 NOTE — Progress Notes (Signed)
[facility] [address] [place], [place] [address] Phone: [phone] Subjective:   [name], am serving as a scribe for Dr. Hulan Saas.   CC: Left knee pain  [id number]  Gabriel Richards is a 53 y.o. male coming in with complaint of left knee pain. States that he has been having popping in his knee for 2 weeks. Today he felt a pop though and the pain did not go away. Patient was bending over at the time it popped. Describes a sharp pain with twisting. Pain over medial aspect.  Patient is noticing locking from time to time.  Had an audible pop but has significant amount of pain today.  Patient is having difficulty even with walking.   X-rays were ordered and independently visualized by me.  X-rays of patient's left knee show very minimal osteoarthritic changes mostly of the medial compartment.  Past Medical History:  Diagnosis Date   Allergy    Arthritis    GERD (gastroesophageal reflux disease)    H. pylori infection    Hiatal hernia    Hx of adenomatous polyp of colon 06/03/2017   Hyperlipidemia    Hypertension    Past Surgical History:  Procedure Laterality Date   ANKLE SURGERY     APPENDECTOMY     CHOLECYSTECTOMY     UPPER GASTROINTESTINAL ENDOSCOPY  2015   H. pylori gastritis   Social History   Socioeconomic History   Marital status: Married    Spouse name: Not on file   Number of children: Not on file   Years of education: Not on file   Highest education level: Not on file  Occupational History   Not on file  Social Needs   Financial resource strain: Not on file   Food insecurity    Worry: Not on file    Inability: Not on file   Transportation needs    Medical: Not on file    Non-medical: Not on file  Tobacco Use   Smoking status: Former Smoker   Smokeless tobacco: Never Used  Substance and Sexual Activity   Alcohol use: No   Drug use: No   Sexual activity: Yes    Partners: Female    Lifestyle   Physical activity    Days per week: Not on file    Minutes per session: Not on file   Stress: Not on file  Relationships   Social connections    Talks on phone: Not on file    Gets together: Not on file    Attends religious service: Not on file    Active member of club or organization: Not on file    Attends meetings of clubs or organizations: Not on file    Relationship status: Not on file  Other Topics Concern   Not on file  Social History Narrative   Married to [place], she works in [employer]   No Known Allergies Family History  Problem Relation Age of Onset   Hyperlipidemia Father    Heart disease Father    Hypertension Father    Diabetes Father    Hypertension Brother    Hyperlipidemia Brother    Early death Neg Hx    Cancer Neg Hx    COPD Neg Hx    Drug abuse Neg Hx    Alcohol abuse Neg Hx    Kidney disease Neg Hx    Stroke Neg Hx    Colon cancer Neg Hx  Esophageal cancer Neg Hx    Rectal cancer Neg Hx    Stomach cancer Neg Hx      Current Outpatient Medications (Cardiovascular):    lisinopril (PRINIVIL,ZESTRIL) 10 MG tablet, Take 10 mg by mouth daily.   rosuvastatin (CRESTOR) 10 MG tablet, Take 10 mg by mouth daily.   Current Outpatient Medications (Analgesics):    aspirin 81 MG tablet, Take 81 mg by mouth daily.   meloxicam (MOBIC) 7.5 MG tablet, Take 1 tablet (7.5 mg total) by mouth daily.   Current Outpatient Medications (Other):    Cholecalciferol (HM VITAMIN D3) 4000 UNITS CAPS, Take by mouth. Once daily   omeprazole (PRILOSEC) 40 MG capsule, TAKE 1 CAPSULE BY MOUTH TWICE DAILY    Past medical history, social, surgical and family history all reviewed in electronic medical record.  No pertanent information unless stated regarding to the chief complaint.   Review of Systems:  No headache, visual changes, nausea, vomiting, diarrhea, constipation, dizziness, abdominal pain, skin rash, fevers,  chills, night sweats, weight loss, swollen lymph nodes, body aches, joint swelling, ] chest pain, shortness of breath, mood changes.  Positive muscle aches  Objective  Blood pressure 128/84, pulse 79, height 5\' 10"  (1.778 m), SpO2 97 %.    General: No apparent distress alert and oriented x3 mood and affect normal, dressed appropriately.  HEENT: Pupils equal, extraocular movements intact  Respiratory: Patient's speak in full sentences and does not appear short of breath  Cardiovascular: No lower extremity edema, non tender, no erythema  Skin: Warm dry intact with no signs of infection or rash on extremities or on axial skeleton.  Abdomen: Soft nontender  Neuro: Cranial nerves II through XII are intact, neurovascularly intact in all extremities with 2+ DTRs and 2+ pulses.  Lymph: No lymphadenopathy of posterior or anterior cervical chain or axillae bilaterally.  Gait normal with good balance and coordination.  MSK:  Non tender with full range of motion and good stability and symmetric strength and tone of shoulders, elbows, wrist, hip and ankles bilaterally.  Knee: Left Trace effusion noted Palpation normal with no warmth, joint line tenderness, patellar tenderness, or condyle ROM  lacks last 5 degrees of extension Ligaments with solid consistent endpoints including ACL, PCL, LCL, MCL. Positive Mcmurray's, Apley's, and Thessalonian tests. Non painful patellar compression. Patellar glide without crepitus. Patellar and quadriceps tendons unremarkable. Hamstring and quadriceps strength is normal.    Impression and Recommendations:     This case required medical decision making of moderate complexity. The above documentation has been reviewed and is accurate and complete Lyndal Pulley, DO       Note: This dictation was prepared with Dragon dictation along with smaller phrase technology. Any transcriptional errors that result from this process are unintentional.

## 2019-06-29 NOTE — Patient Instructions (Signed)
Good to see you Ice 20 minutes 2 times daily. Usually after activity and before bed. Meloxicam sent in Hinge brace today See me again in 3 weeks if not better we will inject

## 2020-01-17 ENCOUNTER — Other Ambulatory Visit (HOSPITAL_COMMUNITY): Payer: Self-pay | Admitting: Family Medicine

## 2020-02-07 ENCOUNTER — Other Ambulatory Visit: Payer: Self-pay | Admitting: Internal Medicine

## 2020-04-20 ENCOUNTER — Other Ambulatory Visit: Payer: Self-pay | Admitting: Internal Medicine

## 2020-09-14 ENCOUNTER — Other Ambulatory Visit (HOSPITAL_COMMUNITY): Payer: Self-pay | Admitting: Family Medicine

## 2020-09-25 ENCOUNTER — Ambulatory Visit: Payer: 59 | Admitting: Family Medicine

## 2020-09-26 ENCOUNTER — Other Ambulatory Visit (HOSPITAL_BASED_OUTPATIENT_CLINIC_OR_DEPARTMENT_OTHER): Payer: Self-pay | Admitting: Physician Assistant

## 2020-09-26 ENCOUNTER — Other Ambulatory Visit: Payer: Self-pay

## 2020-09-26 ENCOUNTER — Encounter (HOSPITAL_BASED_OUTPATIENT_CLINIC_OR_DEPARTMENT_OTHER): Payer: Self-pay | Admitting: Emergency Medicine

## 2020-09-26 ENCOUNTER — Emergency Department (HOSPITAL_BASED_OUTPATIENT_CLINIC_OR_DEPARTMENT_OTHER): Payer: 59

## 2020-09-26 ENCOUNTER — Emergency Department (HOSPITAL_BASED_OUTPATIENT_CLINIC_OR_DEPARTMENT_OTHER)
Admission: EM | Admit: 2020-09-26 | Discharge: 2020-09-26 | Disposition: A | Discharge status: Home / Self Care | Payer: 59 | Attending: Emergency Medicine | Admitting: Emergency Medicine

## 2020-09-26 DIAGNOSIS — Z23 Encounter for immunization: Secondary | ICD-10-CM | POA: Insufficient documentation

## 2020-09-26 DIAGNOSIS — Z87891 Personal history of nicotine dependence: Secondary | ICD-10-CM | POA: Diagnosis not present

## 2020-09-26 DIAGNOSIS — S60459A Superficial foreign body of unspecified finger, initial encounter: Secondary | ICD-10-CM

## 2020-09-26 DIAGNOSIS — E039 Hypothyroidism, unspecified: Secondary | ICD-10-CM | POA: Insufficient documentation

## 2020-09-26 DIAGNOSIS — Z79899 Other long term (current) drug therapy: Secondary | ICD-10-CM | POA: Insufficient documentation

## 2020-09-26 DIAGNOSIS — S60932A Unspecified superficial injury of left thumb, initial encounter: Secondary | ICD-10-CM | POA: Diagnosis present

## 2020-09-26 DIAGNOSIS — Z7982 Long term (current) use of aspirin: Secondary | ICD-10-CM | POA: Insufficient documentation

## 2020-09-26 DIAGNOSIS — W294XXA Contact with nail gun, initial encounter: Secondary | ICD-10-CM | POA: Insufficient documentation

## 2020-09-26 DIAGNOSIS — Z86010 Personal history of colonic polyps: Secondary | ICD-10-CM | POA: Insufficient documentation

## 2020-09-26 DIAGNOSIS — I10 Essential (primary) hypertension: Secondary | ICD-10-CM | POA: Insufficient documentation

## 2020-09-26 HISTORY — DX: Hypothyroidism, unspecified: E03.9

## 2020-09-26 MED ORDER — IBUPROFEN 600 MG PO TABS: 600.0000 mg | ORAL_TABLET | Freq: Four times a day (QID) | ORAL | 0 refills | Status: DC | PRN

## 2020-09-26 MED ORDER — TETANUS-DIPHTH-ACELL PERTUSSIS 5-2.5-18.5 LF-MCG/0.5 IM SUSY
0.5000 mL | PREFILLED_SYRINGE | Freq: Once | INTRAMUSCULAR | Status: AC
  Administered 2020-09-26: 0.5 mL via INTRAMUSCULAR
  Filled 2020-09-26: qty 0.5

## 2020-09-26 MED ORDER — CEPHALEXIN 500 MG PO CAPS: 500.0000 mg | ORAL_CAPSULE | Freq: Four times a day (QID) | ORAL | 0 refills | Status: DC

## 2020-09-26 MED ORDER — LIDOCAINE HCL 2 % IJ SOLN
10.0000 mL | Freq: Once | INTRAMUSCULAR | Status: AC
  Administered 2020-09-26: 200 mg via INTRADERMAL
  Filled 2020-09-26: qty 20

## 2020-09-26 MED ORDER — OXYCODONE-ACETAMINOPHEN 5-325 MG PO TABS
1.0000 | ORAL_TABLET | Freq: Once | ORAL | Status: AC
  Administered 2020-09-26: 1 via ORAL
  Filled 2020-09-26: qty 1

## 2020-09-26 MED ORDER — CEFAZOLIN SODIUM-DEXTROSE 2-4 GM/100ML-% IV SOLN
2.0000 g | Freq: Once | INTRAVENOUS | Status: AC
  Administered 2020-09-26: 2 g via INTRAVENOUS
  Filled 2020-09-26: qty 100

## 2020-09-26 NOTE — ED Provider Notes (Signed)
[facility] EMERGENCY DEPARTMENT Provider Note   CSN: [id number] Arrival date & time: 09/26/20  9417     History Chief Complaint  Patient presents with  . Finger Injury    Gabriel Richards is a 54 y.o. male.  HPI   54 year old male with a history of allergies, arthritis, GERD, H. pylori, hiatal hernia, hyperlipidemia, hypertension, hypothyroidism, who presents to the emergency department today for evaluation of a finger injury.  Patient is a right-handed male and states he was using a nail gun prior to arrival and shot a nail through his left thumb.  States the pain is minimal at this time but is constant and started suddenly.  He denies any numbness.  Denies other injuries.  His Tdap is not up-to-date.  Past Medical History:  Diagnosis Date  . Allergy   . Arthritis   . GERD (gastroesophageal reflux disease)   . H. pylori infection   . Hiatal hernia   . Hx of adenomatous polyp of colon 06/03/2017  . Hyperlipidemia   . Hypertension   . Hypothyroid     Patient Active Problem List   Diagnosis Date Noted  . Acute medial meniscal tear, left, initial encounter 06/29/2019  . Injury of UCL of right wrist 01/27/2019  . Lateral epicondylitis of right elbow 02/04/2018  . Hx of adenomatous polyp of colon 06/03/2017  . Trigger finger, right little finger 04/28/2017  . Benign essential HTN 03/19/2017  . Dyslipidemia 03/19/2017  . Migraine with aura and without status migrainosus, not intractable 12/17/2015  . Trigger finger, acquired 07/10/2015  . Fracture of toe of right foot 07/10/2015  . History of Helicobacter pylori infection 12/23/2013  . HTN (hypertension) 12/19/2013  . Arthritis of ankle, right, degenerative 10/21/2013    Past Surgical History:  Procedure Laterality Date  . ANKLE SURGERY    . APPENDECTOMY    . CHOLECYSTECTOMY    . UPPER GASTROINTESTINAL ENDOSCOPY  2015   H. pylori gastritis       Family History  Problem Relation Age of Onset  .  Hyperlipidemia Father   . Heart disease Father   . Hypertension Father   . Diabetes Father   . Hypertension Brother   . Hyperlipidemia Brother   . Early death Neg Hx   . Cancer Neg Hx   . COPD Neg Hx   . Drug abuse Neg Hx   . Alcohol abuse Neg Hx   . Kidney disease Neg Hx   . Stroke Neg Hx   . Colon cancer Neg Hx   . Esophageal cancer Neg Hx   . Rectal cancer Neg Hx   . Stomach cancer Neg Hx     Social History   Tobacco Use  . Smoking status: Former Research scientist (life sciences)  . Smokeless tobacco: Never Used  Vaping Use  . Vaping Use: Never used  Substance Use Topics  . Alcohol use: No  . Drug use: No    Home Medications Prior to Admission medications   Medication Sig Start Date End Date Taking? Authorizing Provider  albuterol (VENTOLIN HFA) 108 (90 Base) MCG/ACT inhaler Inhale into the lungs. 12/22/19  Yes [provider]  aspirin 81 MG tablet Take 81 mg by mouth daily.   Yes [provider]  Cholecalciferol (HM VITAMIN D3) 4000 UNITS CAPS Take by mouth. Once daily   Yes [provider]  lisinopril (PRINIVIL,ZESTRIL) 10 MG tablet Take 10 mg by mouth daily.   Yes [provider]  LORazepam (ATIVAN) 1 MG tablet  Take by mouth. 12/01/19  Yes [provider]  omeprazole (PRILOSEC) 40 MG capsule TAKE 1 CAPSULE BY MOUTH TWO TIMES DAILY 02/07/20  Yes Gatha Mayer, MD  rosuvastatin (CRESTOR) 10 MG tablet Take 10 mg by mouth daily.   Yes [provider]  cephALEXin (KEFLEX) 500 MG capsule Take 1 capsule (500 mg total) by mouth 4 (four) times daily for 7 days. 09/26/20 10/03/20  Kyndall Chaplin S, PA-C  ibuprofen (ADVIL) 600 MG tablet Take 1 tablet (600 mg total) by mouth every 6 (six) hours as needed. 09/26/20   Reka Wist S, PA-C  levothyroxine (SYNTHROID) 25 MCG tablet Take 25 mcg by mouth daily. 08/29/20   [provider]  meloxicam (MOBIC) 7.5 MG tablet Take 1 tablet (7.5 mg total) by mouth daily. 06/29/19   Lyndal Pulley, DO    sildenafil (REVATIO) 20 MG tablet Take 20-60 mg by mouth daily as needed. 06/27/20   [provider]    Allergies    Patient has no known allergies.  Review of Systems   Review of Systems  Constitutional: Negative for fever.  Musculoskeletal:       Thumb pain  Skin: Positive for wound.  Neurological: Negative for weakness and numbness.    Physical Exam Updated Vital Signs BP (!) 146/96 (BP Location: Right Arm)   Pulse 72   Temp 98.1 F (36.7 C) (Oral)   Resp 16   Ht 5' 10.5" (1.791 m)   Wt 83.9 kg   SpO2 98%   BMI 26.17 kg/m   Physical Exam Constitutional:      General: He is not in acute distress.    Appearance: He is well-developed.  Eyes:     Conjunctiva/sclera: Conjunctivae normal.  Cardiovascular:     Rate and Rhythm: Normal rate.  Pulmonary:     Effort: Pulmonary effort is normal.  Musculoskeletal:     Comments: Nail embedded into the left distal thumb through the phalanx distal to the IP joint. Brisk cap refill distal to the wound and normal sensation noted. ROM and strength are intact at all thumb joints.  Skin:    General: Skin is warm and dry.  Neurological:     Mental Status: He is alert and oriented to person, place, and time.     ED Results / Procedures / Treatments   Labs (all labs ordered are listed, but only abnormal results are displayed) Labs Reviewed - No data to display  EKG None  Radiology DG Finger Thumb Left  Result Date: 09/26/2020 CLINICAL DATA:  Nail gun injury EXAM: LEFT THUMB 2+V COMPARISON:  None. FINDINGS: 2.5 cm metallic nail penetrates the left thumb distal phalanx through the mid diaphysis through both the dorsal and volar cortex. No adjacent fracture lines. No displacement. Minimal arthropathy of the first [facility] and first MCP joints. No malalignment. IMPRESSION: Metallic nail penetrates the left thumb distal phalanx through the mid diaphysis. Electronically Signed   By: Davina Poke D.O.   On: 09/26/2020 10:12     Procedures .Foreign Body Removal  Date/Time: 09/26/2020 12:59 PM Performed by: Rodney Booze, PA-C Authorized by: Rodney Booze, PA-C  Intake: finger. Anesthesia: digital block  Anesthesia: Local Anesthetic: lidocaine 2% without epinephrine  Sedation: Patient sedated: no  Patient restrained: no Complexity: simple 1 objects recovered. Objects recovered: nail Post-procedure assessment: foreign body removed Patient tolerance: patient tolerated the procedure well with no immediate complications   (including critical care time)  Medications Ordered in ED Medications  Tdap (BOOSTRIX) injection 0.5 mL (0.5 mLs Intramuscular Given 09/26/20 0956)  oxyCODONE-acetaminophen (PERCOCET/ROXICET) 5-325 MG per tablet 1 tablet (1 tablet Oral Given 09/26/20 1044)  lidocaine (XYLOCAINE) 2 % (with pres) injection 200 mg (200 mg Intradermal Given by Other 09/26/20 1050)  ceFAZolin (ANCEF) IVPB 2g/100 mL premix (0 g Intravenous Stopped 09/26/20 1214)    ED Course  I have reviewed the triage vital signs and the nursing notes.  Pertinent labs & imaging results that were available during my care of the patient were reviewed by me and considered in my medical decision making (see chart for details).    MDM Rules/Calculators/A&P                          Pt with injury to the left thumb after using nail gun PTA. NVI on exam  Xray of the left thumb reviewed/interpreted -  Metallic nail penetrates the left thumb distal phalanx through the mid diaphysis.   Tdap updated. Ancef given.   10:49 PM CONSULT with Hilbert Odor with hand surgery. Recommends digital block and to push/rotate nail towards the pad of the finger given that it has a head. If unable to remove FB, reconsult hand for eval for transfer.    Wound was soaked in soapy water. It was then cleansed with betadine thoroughly. FB was successfully removed from the finger. Finger placed in splint. Pt given rx for abx for home  and given f/u with hand surgery. Advised on f/u and return precautions. He voices understanding of the plan and reasons to return. All questions answered, pt stable for discharge.  Final Clinical Impression(s) / ED Diagnoses Final diagnoses:  Foreign body of finger    Rx / DC Orders ED Discharge Orders         Ordered    cephALEXin (KEFLEX) 500 MG capsule  4 times daily        09/26/20 1256    ibuprofen (ADVIL) 600 MG tablet  Every 6 hours PRN        09/26/20 1256           Azion Centrella S, PA-C 09/26/20 1300    Sherwood Gambler, MD 09/27/20 1039

## 2020-09-26 NOTE — Discharge Instructions (Addendum)
You were given a prescription for antibiotics. Please take the antibiotic prescription fully.   You may alternate taking Tylenol and Ibuprofen as needed for pain control. You may take 400-600 mg of ibuprofen every 6 hours and [phone] mg of Tylenol every 6 hours. Do not exceed 4000 mg of Tylenol daily as this can lead to liver damage. Also, make sure to take Ibuprofen with meals as it can cause an upset stomach. Do not take other NSAIDs while taking Ibuprofen such as (Aleve, Naprosyn, Aspirin, Celebrex, etc) and do not take more than the prescribed dose as this can lead to ulcers and bleeding in your GI tract. You may use warm and cold compresses to help with your symptoms.   Please follow up with Dr Angus Palms office within the next 7-10 days for re-evaluation and further treatment of your symptoms.   Please return to the ER sooner if you have any new or worsening symptoms.

## 2020-09-26 NOTE — ED Triage Notes (Signed)
States," About 30 minutes ago I got shot with my nail gun" Nail embedded into left thumb

## 2020-10-02 ENCOUNTER — Ambulatory Visit: Payer: 59 | Admitting: Family Medicine

## 2020-10-23 ENCOUNTER — Other Ambulatory Visit (HOSPITAL_COMMUNITY): Payer: Self-pay | Admitting: Family Medicine

## 2020-12-11 ENCOUNTER — Other Ambulatory Visit (HOSPITAL_COMMUNITY): Payer: Self-pay | Admitting: Family Medicine

## 2021-01-31 ENCOUNTER — Other Ambulatory Visit (HOSPITAL_BASED_OUTPATIENT_CLINIC_OR_DEPARTMENT_OTHER): Payer: Self-pay

## 2021-02-12 NOTE — Progress Notes (Signed)
[place] Phone: [phone] Subjective:   [name], am serving as a scribe for Dr. Hulan Saas. This visit occurred during the SARS-CoV-2 public health emergency.  Safety protocols were in place, including screening questions prior to the visit, additional usage of staff PPE, and extensive cleaning of exam room while observing appropriate contact time as indicated for disinfecting solutions.   I'm seeing this patient by the request  of:  [name]., MD  CC: Right ankle and left foot pain  HCW:[id number]  Gabriel Richards is a 55 y.o. male coming in with complaint of heel pain. Last seen in 2020 for knee pain. Patient states that he is on concrete all day. Pain over medial aspect of both heels as well as bottom of of heel. L>R. Very painful if he walks barefoot.   Also notes that he has been walking on outside of R foot due to pain in lateral malleolus. History of crush injury in the 80s.       Past Medical History:  Diagnosis Date  . Allergy   . Arthritis   . GERD (gastroesophageal reflux disease)   . H. pylori infection   . Hiatal hernia   . Hx of adenomatous polyp of colon 06/03/2017  . Hyperlipidemia   . Hypertension   . Hypothyroid    Past Surgical History:  Procedure Laterality Date  . ANKLE SURGERY    . APPENDECTOMY    . CHOLECYSTECTOMY    . UPPER GASTROINTESTINAL ENDOSCOPY  2015   H. pylori gastritis   Social History   Socioeconomic History  . Marital status: Married    Spouse name: Not on file  . Number of children: Not on file  . Years of education: Not on file  . Highest education level: Not on file  Occupational History  . Not on file  Tobacco Use  . Smoking status: Former Research scientist (life sciences)  . Smokeless tobacco: Never Used  Vaping Use  . Vaping Use: Never used  Substance and Sexual Activity  . Alcohol use: No  . Drug use: No  . Sexual activity: Yes    Partners: Female  Other  Topics Concern  . Not on file  Social History Narrative   Married to [place], she works in Financial controller primary care [employer]   Social Determinants of Health   Financial Resource Strain: Not on file  Food Insecurity: Not on file  Transportation Needs: Not on file  Physical Activity: Not on file  Stress: Not on file  Social Connections: Not on file   No Known Allergies Family History  Problem Relation Age of Onset  . Hyperlipidemia Father   . Heart disease Father   . Hypertension Father   . Diabetes Father   . Hypertension Brother   . Hyperlipidemia Brother   . Early death Neg Hx   . Cancer Neg Hx   . COPD Neg Hx   . Drug abuse Neg Hx   . Alcohol abuse Neg Hx   . Kidney disease Neg Hx   . Stroke Neg Hx   . Colon cancer Neg Hx   . Esophageal cancer Neg Hx   . Rectal cancer Neg Hx   . Stomach cancer Neg Hx     Current Outpatient Medications (Endocrine & Metabolic):  .  levothyroxine (SYNTHROID) 25 MCG tablet, Take 25 mcg by mouth daily. Marland Kitchen  levothyroxine (SYNTHROID) 25 MCG tablet, TAKE 1 TABLET BY MOUTH ONCE DAILY .  predniSONE (DELTASONE) 20 MG tablet, Take 1 tablet (20 mg total) by mouth daily with breakfast. .  levothyroxine (SYNTHROID) 25 MCG tablet, TAKE 1 TABLET BY MOUTH ONCE DAILY  Current Outpatient Medications (Cardiovascular):  .  lisinopril (PRINIVIL,ZESTRIL) 10 MG tablet, Take 10 mg by mouth daily. Marland Kitchen  lisinopril (ZESTRIL) 10 MG tablet, TAKE 1 TABLET BY MOUTH ONCE A DAY .  rosuvastatin (CRESTOR) 10 MG tablet, Take 10 mg by mouth daily. .  rosuvastatin (CRESTOR) 40 MG tablet, TAKE 1 TABLET BY MOUTH EVERY OTHER DAY .  rosuvastatin (CRESTOR) 40 MG tablet, TAKE 1 TABLET BY MOUTH EVERY OTHER DAY .  sildenafil (REVATIO) 20 MG tablet, Take 20-60 mg by mouth daily as needed. Marland Kitchen  lisinopril (ZESTRIL) 10 MG tablet, TAKE 1 TABLET BY MOUTH ONCE DAILY  Current Outpatient Medications (Respiratory):  .  albuterol (VENTOLIN HFA) 108 (90 Base) MCG/ACT inhaler, Inhale into the  lungs.  Current Outpatient Medications (Analgesics):  .  aspirin 81 MG tablet, Take 81 mg by mouth daily. Marland Kitchen  ibuprofen (ADVIL) 600 MG tablet, TAKE 1 TABLET (600 MG TOTAL) BY MOUTH EVERY 6 (SIX) HOURS AS NEEDED. Marland Kitchen  meloxicam (MOBIC) 7.5 MG tablet, Take 1 tablet (7.5 mg total) by mouth daily.   Current Outpatient Medications (Other):  .  cephALEXin (KEFLEX) 500 MG capsule, TAKE 1 CAPSULE (500 MG TOTAL) BY MOUTH 4 (FOUR) TIMES DAILY FOR 7 DAYS. Marland Kitchen  Cholecalciferol 100 MCG (4000 UT) CAPS, Take by mouth. Once daily .  LORazepam (ATIVAN) 1 MG tablet, Take by mouth. Marland Kitchen  omeprazole (PRILOSEC) 40 MG capsule, TAKE 1 CAPSULE BY MOUTH TWO TIMES DAILY .  omeprazole (PRILOSEC) 40 MG capsule, TAKE 1 CAPSULE BY MOUTH ONCE A DAY   Reviewed prior external information including notes and imaging from  primary care provider As well as notes that were available from care everywhere and other healthcare systems.  Past medical history, social, surgical and family history all reviewed in electronic medical record.  No pertanent information unless stated regarding to the chief complaint.   Review of Systems:  No headache, visual changes, nausea, vomiting, diarrhea, constipation, dizziness, abdominal pain, skin rash, fevers, chills, night sweats, weight loss, swollen lymph nodes, body aches, chest pain, shortness of breath, mood changes. POSITIVE muscle aches, joint swelling  Objective  Blood pressure (!) 142/98, pulse 87, height 5\' 10"  (1.778 m), weight 186 lb (84.4 kg), SpO2 99 %.   General: No apparent distress alert and oriented x3 mood and affect normal, dressed appropriately.  HEENT: Pupils equal, extraocular movements intact  Respiratory: Patient's speak in full sentences and does not appear short of breath  Cardiovascular: No lower extremity edema, non tender, no erythema  Gait mild antalgic Left foot exam shows the patient does have pes planus.  Patient has actually oversupination of the feet  bilaterally.  Patient does have limited range of motion of the right ankle noted.  Severe tenderness over the medial calcaneal area on the left foot.  Limited musculoskeletal ultrasound was performed and interpreted by Lyndal Pulley  Limited ultrasound of patient's left plantar fascia shows significant hypoechoic changes and significant swelling noted of the plantar fascia at the origin of the calcaneal.  This is consistent with severe plantar fasciitis. Impression: Severe plantar fasciitis of the left foot     Impression and Recommendations:     The above documentation has been reviewed and is accurate and complete Lyndal Pulley, DO

## 2021-02-13 ENCOUNTER — Ambulatory Visit: Payer: Self-pay

## 2021-02-13 ENCOUNTER — Other Ambulatory Visit: Payer: Self-pay

## 2021-02-13 ENCOUNTER — Ambulatory Visit (INDEPENDENT_AMBULATORY_CARE_PROVIDER_SITE_OTHER): Payer: 59

## 2021-02-13 ENCOUNTER — Encounter: Payer: Self-pay | Admitting: Family Medicine

## 2021-02-13 ENCOUNTER — Other Ambulatory Visit (HOSPITAL_COMMUNITY): Payer: Self-pay

## 2021-02-13 ENCOUNTER — Ambulatory Visit: Payer: 59 | Admitting: Family Medicine

## 2021-02-13 VITALS — BP 142/98 | HR 87 | Ht 70.0 in | Wt 186.0 lb

## 2021-02-13 DIAGNOSIS — M79672 Pain in left foot: Secondary | ICD-10-CM

## 2021-02-13 DIAGNOSIS — M25571 Pain in right ankle and joints of right foot: Secondary | ICD-10-CM | POA: Diagnosis not present

## 2021-02-13 DIAGNOSIS — M79671 Pain in right foot: Secondary | ICD-10-CM | POA: Diagnosis not present

## 2021-02-13 DIAGNOSIS — G8929 Other chronic pain: Secondary | ICD-10-CM

## 2021-02-13 DIAGNOSIS — M19071 Primary osteoarthritis, right ankle and foot: Secondary | ICD-10-CM

## 2021-02-13 DIAGNOSIS — M722 Plantar fascial fibromatosis: Secondary | ICD-10-CM | POA: Diagnosis not present

## 2021-02-13 MED ORDER — PREDNISONE 20 MG PO TABS
20.0000 mg | ORAL_TABLET | Freq: Every day | ORAL | 0 refills | Status: DC
  Filled 2021-02-13: qty 5, 5d supply, fill #0

## 2021-02-13 MED FILL — Omeprazole Cap Delayed Release 40 MG: ORAL | 30 days supply | Qty: 30 | Fill #0 | Status: AC

## 2021-02-13 MED FILL — Rosuvastatin Calcium Tab 40 MG: ORAL | 30 days supply | Qty: 15 | Fill #0 | Status: AC

## 2021-02-13 MED FILL — Lisinopril Tab 10 MG: ORAL | 30 days supply | Qty: 30 | Fill #0 | Status: AC

## 2021-02-13 NOTE — Assessment & Plan Note (Signed)
Patient does have more of a plantar fasciitis.  Seems to be on the left side.  Fairly large.  Will get custom orthotics secondary to the severity of this and patient's ankle arthritis.  We discussed rocker-bottom shoes, discussed different shoes in the house.  Discussed icing regimen.  Follow-up with me again in 6 weeks

## 2021-02-13 NOTE — Assessment & Plan Note (Signed)
Patient does have arthritic changes of the ankle.  We will get a repeat x-ray to further evaluate.  Discussed with different shoes.  We will get patient referred for custom orthotics that I think will be beneficial for this as well as the severe plantar fasciitis on the contralateral side.  Worsening pain can always consider the possibility of formal physical therapy or injection.  Follow-up again in 6 weeks

## 2021-02-13 NOTE — Patient Instructions (Signed)
Orthotics-They will call you Hoka or OOFOS sandals in the house Rocker bottom shoes: HOKA, [place], [employer]ity Defyer Prednisone 20mg  for 5 days Do not take IBU with Prednisone Ice 20 min 2x a day Exercises See me again in 6 weeks

## 2021-02-16 ENCOUNTER — Other Ambulatory Visit (HOSPITAL_COMMUNITY): Payer: Self-pay

## 2021-02-18 ENCOUNTER — Other Ambulatory Visit (HOSPITAL_COMMUNITY): Payer: Self-pay

## 2021-02-21 ENCOUNTER — Other Ambulatory Visit (HOSPITAL_COMMUNITY): Payer: Self-pay

## 2021-02-21 MED FILL — Levothyroxine Sodium Tab 25 MCG: ORAL | 30 days supply | Qty: 30 | Fill #0 | Status: AC

## 2021-02-22 ENCOUNTER — Other Ambulatory Visit (HOSPITAL_COMMUNITY): Payer: Self-pay

## 2021-02-22 MED ORDER — SILDENAFIL CITRATE 20 MG PO TABS
ORAL_TABLET | ORAL | 5 refills | Status: DC
  Filled 2021-02-22: qty 15, 30d supply, fill #0

## 2021-03-28 ENCOUNTER — Other Ambulatory Visit (HOSPITAL_COMMUNITY): Payer: Self-pay

## 2021-03-28 MED FILL — Omeprazole Cap Delayed Release 40 MG: ORAL | 30 days supply | Qty: 30 | Fill #1 | Status: AC

## 2021-03-28 MED FILL — Rosuvastatin Calcium Tab 40 MG: ORAL | 30 days supply | Qty: 15 | Fill #1 | Status: AC

## 2021-03-28 MED FILL — Levothyroxine Sodium Tab 25 MCG: ORAL | 30 days supply | Qty: 30 | Fill #1 | Status: AC

## 2021-03-28 NOTE — Progress Notes (Signed)
[place] Phone: [phone] Subjective:   [name], am serving as a scribe for Dr. Hulan Saas. This visit occurred during the SARS-CoV-2 public health emergency.  Safety protocols were in place, including screening questions prior to the visit, additional usage of staff PPE, and extensive cleaning of exam room while observing appropriate contact time as indicated for disinfecting solutions.   I'm seeing this patient by the request  of:  [name]., MD  CC: foot and ankle pain follow up   [id number]   02/13/2021 Patient does have more of a plantar fasciitis.  Seems to be on the left side.  Fairly large.  Will get custom orthotics secondary to the severity of this and patient's ankle arthritis.  We discussed rocker-bottom shoes, discussed different shoes in the house.  Discussed icing regimen.  Follow-up with me again in 6 weeks  Patient does have arthritic changes of the ankle.  We will get a repeat x-ray to further evaluate.  Discussed with different shoes.  We will get patient referred for custom orthotics that I think will be beneficial for this as well as the severe plantar fasciitis on the contralateral side.  Worsening pain can always consider the possibility of formal physical therapy or injection.  Follow-up again in 6 weeks  Update 04/02/2021 Clemente Dewey is a 55 y.o. male coming in with complaint of L foot and R ankle. Patient states that heels are getting better but pain still present near end of the day. Has not gotten custom orthotics yet due to price. Is planning on getting them eventually.   Pain in posterior aspect of lateral malleolus especially in the mornings and after sitting down and getting back up. Does wear [employer]OK[employer] shoes to work.  Patient would state that he is feeling approximately 50% better than previously.   Patient did have x-rays of the ankle done on February 13, 2021.  X-ray  showed the patient did have a very significant amount of arthritic changes of the talocalcaneal joint but seems to be stable from previous imaging and postsurgical changes of a talar fixation appears stable.    Past Medical History:  Diagnosis Date  . Allergy   . Arthritis   . GERD (gastroesophageal reflux disease)   . H. pylori infection   . Hiatal hernia   . Hx of adenomatous polyp of colon 06/03/2017  . Hyperlipidemia   . Hypertension   . Hypothyroid    Past Surgical History:  Procedure Laterality Date  . ANKLE SURGERY    . APPENDECTOMY    . CHOLECYSTECTOMY    . UPPER GASTROINTESTINAL ENDOSCOPY  2015   H. pylori gastritis   Social History   Socioeconomic History  . Marital status: Married    Spouse name: Not on file  . Number of children: Not on file  . Years of education: Not on file  . Highest education level: Not on file  Occupational History  . Not on file  Tobacco Use  . Smoking status: Former Research scientist (life sciences)  . Smokeless tobacco: Never Used  Vaping Use  . Vaping Use: Never used  Substance and Sexual Activity  . Alcohol use: No  . Drug use: No  . Sexual activity: Yes    Partners: Female  Other Topics Concern  . Not on file  Social History Narrative   Married to [place], she works in [employer]   Social Determinants of Health  Financial Resource Strain: Not on file  Food Insecurity: Not on file  Transportation Needs: Not on file  Physical Activity: Not on file  Stress: Not on file  Social Connections: Not on file   No Known Allergies Family History  Problem Relation Age of Onset  . Hyperlipidemia Father   . Heart disease Father   . Hypertension Father   . Diabetes Father   . Hypertension Brother   . Hyperlipidemia Brother   . Early death Neg Hx   . Cancer Neg Hx   . COPD Neg Hx   . Drug abuse Neg Hx   . Alcohol abuse Neg Hx   . Kidney disease Neg Hx   . Stroke Neg Hx   . Colon cancer Neg Hx   . Esophageal cancer Neg Hx   . Rectal  cancer Neg Hx   . Stomach cancer Neg Hx     Current Outpatient Medications (Endocrine & Metabolic):  .  levothyroxine (SYNTHROID) 25 MCG tablet, Take 25 mcg by mouth daily. Marland Kitchen  levothyroxine (SYNTHROID) 25 MCG tablet, TAKE 1 TABLET BY MOUTH ONCE DAILY .  predniSONE (DELTASONE) 20 MG tablet, Take 1 tablet (20 mg total) by mouth daily with breakfast. .  levothyroxine (SYNTHROID) 25 MCG tablet, TAKE 1 TABLET BY MOUTH ONCE DAILY  Current Outpatient Medications (Cardiovascular):  .  lisinopril (PRINIVIL,ZESTRIL) 10 MG tablet, Take 10 mg by mouth daily. Marland Kitchen  lisinopril (ZESTRIL) 10 MG tablet, TAKE 1 TABLET BY MOUTH ONCE A DAY .  rosuvastatin (CRESTOR) 10 MG tablet, Take 10 mg by mouth daily. .  rosuvastatin (CRESTOR) 40 MG tablet, TAKE 1 TABLET BY MOUTH EVERY OTHER DAY .  rosuvastatin (CRESTOR) 40 MG tablet, TAKE 1 TABLET BY MOUTH EVERY OTHER DAY .  sildenafil (REVATIO) 20 MG tablet, Take 20-60 mg by mouth daily as needed. .  sildenafil (REVATIO) 20 MG tablet, TAKE 1-3 TABLETS BY MOUTH DAILY AS NEEDED. Marland Kitchen  lisinopril (ZESTRIL) 10 MG tablet, TAKE 1 TABLET BY MOUTH ONCE DAILY  Current Outpatient Medications (Respiratory):  .  albuterol (VENTOLIN HFA) 108 (90 Base) MCG/ACT inhaler, Inhale into the lungs.  Current Outpatient Medications (Analgesics):  .  aspirin 81 MG tablet, Take 81 mg by mouth daily. Marland Kitchen  ibuprofen (ADVIL) 600 MG tablet, TAKE 1 TABLET (600 MG TOTAL) BY MOUTH EVERY 6 (SIX) HOURS AS NEEDED. Marland Kitchen  meloxicam (MOBIC) 7.5 MG tablet, Take 1 tablet (7.5 mg total) by mouth daily.   Current Outpatient Medications (Other):  .  cephALEXin (KEFLEX) 500 MG capsule, TAKE 1 CAPSULE (500 MG TOTAL) BY MOUTH 4 (FOUR) TIMES DAILY FOR 7 DAYS. Marland Kitchen  Cholecalciferol 100 MCG (4000 UT) CAPS, Take by mouth. Once daily .  LORazepam (ATIVAN) 1 MG tablet, Take by mouth. Marland Kitchen  omeprazole (PRILOSEC) 40 MG capsule, TAKE 1 CAPSULE BY MOUTH TWO TIMES DAILY .  omeprazole (PRILOSEC) 40 MG capsule, TAKE 1 CAPSULE BY MOUTH  ONCE A DAY     Review of Systems:  No headache, visual changes, nausea, vomiting, diarrhea, constipation, dizziness, abdominal pain, skin rash, fevers, chills, night sweats, weight loss, swollen lymph nodes, body aches, joint swelling, chest pain, shortness of breath, mood changes. POSITIVE muscle aches  Objective  Blood pressure (!) 144/92, pulse 84, height 5\' 10"  (1.778 m), SpO2 97 %.   General: No apparent distress alert and oriented x3 mood and affect normal, dressed appropriately.  HEENT: Pupils equal, extraocular movements intact  Respiratory: Patient's speak in full sentences and does not appear short of breath  Cardiovascular: No lower extremity edema, non tender, no erythema  Gait mild antalgic Limited range of motion of the ankle right greater than left noted. Patient does have some tenderness to palpation in the calcaneal region on the plantar flexion of the foot left greater than right.  Patient has mild tightness of the posterior cord left greater than right as well.  Limited musculoskeletal ultrasound was performed and interpreted by Lyndal Pulley  Limited ultrasound of patient's plantar fashion on the left side still shows the patient does have hypoechoic changes noted.  Patient also has what appears to be enlargement of the plantar fascia noted.  No true acute tear is appreciated though. Impression: Continued inflammation of the plantar fascial   Impression and Recommendations:     The above documentation has been reviewed and is accurate and complete Lyndal Pulley, DO

## 2021-04-02 ENCOUNTER — Ambulatory Visit: Payer: Self-pay

## 2021-04-02 ENCOUNTER — Ambulatory Visit: Payer: 59 | Admitting: Family Medicine

## 2021-04-02 ENCOUNTER — Other Ambulatory Visit: Payer: Self-pay

## 2021-04-02 ENCOUNTER — Encounter: Payer: Self-pay | Admitting: Family Medicine

## 2021-04-02 VITALS — BP 144/92 | HR 84 | Ht 70.0 in

## 2021-04-02 DIAGNOSIS — M722 Plantar fascial fibromatosis: Secondary | ICD-10-CM | POA: Diagnosis not present

## 2021-04-02 DIAGNOSIS — M25572 Pain in left ankle and joints of left foot: Secondary | ICD-10-CM

## 2021-04-02 DIAGNOSIS — M25571 Pain in right ankle and joints of right foot: Secondary | ICD-10-CM

## 2021-04-02 DIAGNOSIS — M19071 Primary osteoarthritis, right ankle and foot: Secondary | ICD-10-CM | POA: Diagnosis not present

## 2021-04-02 NOTE — Patient Instructions (Signed)
I do see some improvement Try to do exercises Ice at end of long days Continue good shoes Let's check back in 6-8 weeks

## 2021-04-02 NOTE — Assessment & Plan Note (Signed)
Patient overall has made some improvement.  Still has hypoechoic changes are noted on ultrasound.  Patient unfortunately has not made the custom orthotics secondary to the price.  Patient will be considering this in the near future.  Discussed continuing the good shoes and home exercises.  Follow-up with me again 6 to 8 weeks.  Worsening pain consider injection

## 2021-04-02 NOTE — Assessment & Plan Note (Signed)
Stable -no changes  

## 2021-04-22 ENCOUNTER — Other Ambulatory Visit (HOSPITAL_COMMUNITY): Payer: Self-pay

## 2021-04-22 MED FILL — Lisinopril Tab 10 MG: ORAL | 30 days supply | Qty: 30 | Fill #1 | Status: AC

## 2021-04-23 ENCOUNTER — Other Ambulatory Visit (HOSPITAL_COMMUNITY): Payer: Self-pay

## 2021-05-09 ENCOUNTER — Other Ambulatory Visit (HOSPITAL_COMMUNITY): Payer: Self-pay

## 2021-05-09 MED FILL — Rosuvastatin Calcium Tab 40 MG: ORAL | 30 days supply | Qty: 15 | Fill #2 | Status: AC

## 2021-05-09 MED FILL — Levothyroxine Sodium Tab 25 MCG: ORAL | 30 days supply | Qty: 30 | Fill #2 | Status: AC

## 2021-05-09 MED FILL — Omeprazole Cap Delayed Release 40 MG: ORAL | 30 days supply | Qty: 30 | Fill #2 | Status: AC

## 2021-05-10 ENCOUNTER — Other Ambulatory Visit (HOSPITAL_COMMUNITY): Payer: Self-pay

## 2021-05-15 NOTE — Progress Notes (Deleted)
[place] Phone: [phone] Subjective:    I'm seeing this patient by the request  of:  [name]., MD  CC:   [id number]  04/02/2021 Patient overall has made some improvement.  Still has hypoechoic changes are noted on ultrasound.  Patient unfortunately has not made the custom orthotics secondary to the price.  Patient will be considering this in the near future.  Discussed continuing the good shoes and home exercises.  Follow-up with me again 6 to 8 weeks.  Worsening pain consider injection  Update 05/23/2021 Jessejames Steelman is a 55 y.o. male coming in with complaint of L foot pain.   Onset-  Location Duration-  Character- Aggravating factors- Reliving factors-  Therapies tried-  Severity-     Past Medical History:  Diagnosis Date   Allergy    Arthritis    GERD (gastroesophageal reflux disease)    H. pylori infection    Hiatal hernia    Hx of adenomatous polyp of colon 06/03/2017   Hyperlipidemia    Hypertension    Hypothyroid    Past Surgical History:  Procedure Laterality Date   ANKLE SURGERY     APPENDECTOMY     CHOLECYSTECTOMY     UPPER GASTROINTESTINAL ENDOSCOPY  2015   H. pylori gastritis   Social History   Socioeconomic History   Marital status: Married    Spouse name: Not on file   Number of children: Not on file   Years of education: Not on file   Highest education level: Not on file  Occupational History   Not on file  Tobacco Use   Smoking status: Former    Pack years: 0.00   Smokeless tobacco: Never  Vaping Use   Vaping Use: Never used  Substance and Sexual Activity   Alcohol use: No   Drug use: No   Sexual activity: Yes    Partners: Female  Other Topics Concern   Not on file  Social History Narrative   Married to [place], she works in [employer] primary [employer]   Social Determinants of Radio broadcast assistant Strain: Not on file  Food Insecurity:  Not on file  Transportation Needs: Not on file  Physical Activity: Not on file  Stress: Not on file  Social Connections: Not on file   No Known Allergies Family History  Problem Relation Age of Onset   Hyperlipidemia Father    Heart disease Father    Hypertension Father    Diabetes Father    Hypertension Brother    Hyperlipidemia Brother    Early death Neg Hx    Cancer Neg Hx    COPD Neg Hx    Drug abuse Neg Hx    Alcohol abuse Neg Hx    Kidney disease Neg Hx    Stroke Neg Hx    Colon cancer Neg Hx    Esophageal cancer Neg Hx    Rectal cancer Neg Hx    Stomach cancer Neg Hx     Current Outpatient Medications (Endocrine & Metabolic):    levothyroxine (SYNTHROID) 25 MCG tablet, Take 25 mcg by mouth daily.   levothyroxine (SYNTHROID) 25 MCG tablet, TAKE 1 TABLET BY MOUTH ONCE DAILY   levothyroxine (SYNTHROID) 25 MCG tablet, TAKE 1 TABLET BY MOUTH ONCE DAILY   predniSONE (DELTASONE) 20 MG tablet, Take 1 tablet (20 mg total) by mouth daily with breakfast.  Current Outpatient Medications (Cardiovascular):  lisinopril (PRINIVIL,ZESTRIL) 10 MG tablet, Take 10 mg by mouth daily.   lisinopril (ZESTRIL) 10 MG tablet, TAKE 1 TABLET BY MOUTH ONCE A DAY   lisinopril (ZESTRIL) 10 MG tablet, TAKE 1 TABLET BY MOUTH ONCE DAILY   rosuvastatin (CRESTOR) 10 MG tablet, Take 10 mg by mouth daily.   rosuvastatin (CRESTOR) 40 MG tablet, TAKE 1 TABLET BY MOUTH EVERY OTHER DAY   rosuvastatin (CRESTOR) 40 MG tablet, TAKE 1 TABLET BY MOUTH EVERY OTHER DAY   sildenafil (REVATIO) 20 MG tablet, Take 20-60 mg by mouth daily as needed.   sildenafil (REVATIO) 20 MG tablet, TAKE 1-3 TABLETS BY MOUTH DAILY AS NEEDED.  Current Outpatient Medications (Respiratory):    albuterol (VENTOLIN HFA) 108 (90 Base) MCG/ACT inhaler, Inhale into the lungs.  Current Outpatient Medications (Analgesics):    aspirin 81 MG tablet, Take 81 mg by mouth daily.   ibuprofen (ADVIL) 600 MG tablet, TAKE 1 TABLET (600 MG TOTAL)  BY MOUTH EVERY 6 (SIX) HOURS AS NEEDED.   meloxicam (MOBIC) 7.5 MG tablet, Take 1 tablet (7.5 mg total) by mouth daily.   Current Outpatient Medications (Other):    cephALEXin (KEFLEX) 500 MG capsule, TAKE 1 CAPSULE (500 MG TOTAL) BY MOUTH 4 (FOUR) TIMES DAILY FOR 7 DAYS.   Cholecalciferol 100 MCG (4000 UT) CAPS, Take by mouth. Once daily   LORazepam (ATIVAN) 1 MG tablet, Take by mouth.   omeprazole (PRILOSEC) 40 MG capsule, TAKE 1 CAPSULE BY MOUTH TWO TIMES DAILY   omeprazole (PRILOSEC) 40 MG capsule, TAKE 1 CAPSULE BY MOUTH ONCE A DAY   Reviewed prior external information including notes and imaging from  primary care provider As well as notes that were available from care everywhere and other healthcare systems.  Past medical history, social, surgical and family history all reviewed in electronic medical record.  No pertanent information unless stated regarding to the chief complaint.   Review of Systems:  No headache, visual changes, nausea, vomiting, diarrhea, constipation, dizziness, abdominal pain, skin rash, fevers, chills, night sweats, weight loss, swollen lymph nodes, body aches, joint swelling, chest pain, shortness of breath, mood changes. POSITIVE muscle aches  Objective  There were no vitals taken for this visit.   General: No apparent distress alert and oriented x3 mood and affect normal, dressed appropriately.  HEENT: Pupils equal, extraocular movements intact  Respiratory: Patient's speak in full sentences and does not appear short of breath  Cardiovascular: No lower extremity edema, non tender, no erythema  Gait normal with good balance and coordination.  MSK:  Non tender with full range of motion and good stability and symmetric strength and tone of shoulders, elbows, wrist, hip, knee and ankles bilaterally.     Impression and Recommendations:     The above documentation has been reviewed and is accurate and complete Jacqualin Combes

## 2021-05-23 ENCOUNTER — Ambulatory Visit: Payer: 59 | Admitting: Family Medicine

## 2021-05-31 ENCOUNTER — Other Ambulatory Visit (HOSPITAL_COMMUNITY): Payer: Self-pay

## 2021-05-31 MED FILL — Lisinopril Tab 10 MG: ORAL | 30 days supply | Qty: 30 | Fill #2 | Status: AC

## 2021-06-10 ENCOUNTER — Other Ambulatory Visit (HOSPITAL_COMMUNITY): Payer: Self-pay

## 2021-06-10 MED ORDER — OMEPRAZOLE 40 MG PO CPDR
40.0000 mg | DELAYED_RELEASE_CAPSULE | Freq: Every day | ORAL | 0 refills | Status: DC
  Filled 2021-06-10: qty 30, 30d supply, fill #0
  Filled 2021-07-12: qty 30, 30d supply, fill #1
  Filled 2021-08-16: qty 30, 30d supply, fill #2

## 2021-06-10 MED FILL — Levothyroxine Sodium Tab 25 MCG: ORAL | 30 days supply | Qty: 30 | Fill #3 | Status: AC

## 2021-06-10 MED FILL — Rosuvastatin Calcium Tab 40 MG: ORAL | 30 days supply | Qty: 15 | Fill #3 | Status: AC

## 2021-07-12 ENCOUNTER — Other Ambulatory Visit (HOSPITAL_COMMUNITY): Payer: Self-pay

## 2021-07-12 MED FILL — Rosuvastatin Calcium Tab 40 MG: ORAL | 30 days supply | Qty: 15 | Fill #4 | Status: AC

## 2021-07-12 MED FILL — Levothyroxine Sodium Tab 25 MCG: ORAL | 30 days supply | Qty: 30 | Fill #4 | Status: AC

## 2021-07-12 MED FILL — Lisinopril Tab 10 MG: ORAL | 30 days supply | Qty: 30 | Fill #3 | Status: AC

## 2021-08-16 ENCOUNTER — Other Ambulatory Visit (HOSPITAL_COMMUNITY): Payer: Self-pay

## 2021-08-16 MED FILL — Levothyroxine Sodium Tab 25 MCG: ORAL | 30 days supply | Qty: 30 | Fill #5 | Status: AC

## 2021-08-16 MED FILL — Lisinopril Tab 10 MG: ORAL | 30 days supply | Qty: 30 | Fill #4 | Status: AC

## 2021-08-16 MED FILL — Rosuvastatin Calcium Tab 40 MG: ORAL | 30 days supply | Qty: 15 | Fill #5 | Status: AC

## 2021-08-19 ENCOUNTER — Other Ambulatory Visit (HOSPITAL_COMMUNITY): Payer: Self-pay

## 2021-09-24 ENCOUNTER — Other Ambulatory Visit (HOSPITAL_COMMUNITY): Payer: Self-pay

## 2021-09-24 MED ORDER — OMEPRAZOLE 40 MG PO CPDR
40.0000 mg | DELAYED_RELEASE_CAPSULE | Freq: Every day | ORAL | 0 refills | Status: DC
  Filled 2021-09-24: qty 30, 30d supply, fill #0

## 2021-09-24 MED FILL — Lisinopril Tab 10 MG: ORAL | 30 days supply | Qty: 30 | Fill #5 | Status: AC

## 2021-09-24 MED FILL — Levothyroxine Sodium Tab 25 MCG: ORAL | 30 days supply | Qty: 30 | Fill #6 | Status: AC

## 2021-09-24 MED FILL — Rosuvastatin Calcium Tab 40 MG: ORAL | 30 days supply | Qty: 15 | Fill #6 | Status: AC

## 2021-09-25 ENCOUNTER — Other Ambulatory Visit (HOSPITAL_COMMUNITY): Payer: Self-pay

## 2021-10-16 ENCOUNTER — Other Ambulatory Visit (HOSPITAL_COMMUNITY): Payer: Self-pay

## 2021-10-16 MED ORDER — ROSUVASTATIN CALCIUM 40 MG PO TABS
ORAL_TABLET | ORAL | 3 refills | Status: DC
  Filled 2021-10-16: qty 15, 30d supply, fill #0

## 2021-10-16 MED ORDER — SILDENAFIL CITRATE 20 MG PO TABS
ORAL_TABLET | ORAL | 5 refills | Status: AC
  Filled 2021-10-16: qty 15, 30d supply, fill #0

## 2021-10-16 MED ORDER — LISINOPRIL 10 MG PO TABS
10.0000 mg | ORAL_TABLET | Freq: Every day | ORAL | 3 refills | Status: DC
  Filled 2021-10-16: qty 30, 30d supply, fill #0
  Filled 2021-12-05: qty 30, 30d supply, fill #1
  Filled 2022-01-11: qty 30, 30d supply, fill #2
  Filled 2022-02-06: qty 30, 30d supply, fill #3
  Filled 2022-03-19: qty 30, 30d supply, fill #4
  Filled 2022-04-26: qty 30, 30d supply, fill #5
  Filled 2022-06-09: qty 30, 30d supply, fill #6
  Filled 2022-07-12: qty 30, 30d supply, fill #7
  Filled 2022-08-29: qty 30, 30d supply, fill #8
  Filled 2022-10-13: qty 30, 30d supply, fill #9

## 2021-10-16 MED ORDER — LEVOTHYROXINE SODIUM 25 MCG PO TABS
25.0000 ug | ORAL_TABLET | Freq: Every day | ORAL | 3 refills | Status: DC
  Filled 2021-10-16: qty 30, 30d supply, fill #0

## 2021-10-17 ENCOUNTER — Other Ambulatory Visit (HOSPITAL_COMMUNITY): Payer: Self-pay

## 2021-10-28 ENCOUNTER — Other Ambulatory Visit (HOSPITAL_COMMUNITY): Payer: Self-pay

## 2021-10-28 MED ORDER — OMEPRAZOLE 40 MG PO CPDR
40.0000 mg | DELAYED_RELEASE_CAPSULE | Freq: Every day | ORAL | 1 refills | Status: DC
  Filled 2021-10-28: qty 30, 30d supply, fill #0
  Filled 2021-12-05 (×2): qty 30, 30d supply, fill #1

## 2021-12-05 ENCOUNTER — Other Ambulatory Visit (INDEPENDENT_AMBULATORY_CARE_PROVIDER_SITE_OTHER): Payer: BC Managed Care – PPO

## 2021-12-05 ENCOUNTER — Encounter: Payer: Self-pay | Admitting: Internal Medicine

## 2021-12-05 ENCOUNTER — Other Ambulatory Visit (HOSPITAL_COMMUNITY): Payer: Self-pay

## 2021-12-05 ENCOUNTER — Ambulatory Visit: Payer: BC Managed Care – PPO | Admitting: Internal Medicine

## 2021-12-05 VITALS — BP 150/90 | HR 102 | Ht 70.0 in | Wt 206.0 lb

## 2021-12-05 DIAGNOSIS — R6881 Early satiety: Secondary | ICD-10-CM

## 2021-12-05 DIAGNOSIS — R12 Heartburn: Secondary | ICD-10-CM | POA: Diagnosis not present

## 2021-12-05 DIAGNOSIS — R748 Abnormal levels of other serum enzymes: Secondary | ICD-10-CM

## 2021-12-05 DIAGNOSIS — R1013 Epigastric pain: Secondary | ICD-10-CM

## 2021-12-05 DIAGNOSIS — K915 Postcholecystectomy syndrome: Secondary | ICD-10-CM

## 2021-12-05 DIAGNOSIS — R635 Abnormal weight gain: Secondary | ICD-10-CM

## 2021-12-05 DIAGNOSIS — K9089 Other intestinal malabsorption: Secondary | ICD-10-CM

## 2021-12-05 LAB — HEPATIC FUNCTION PANEL
ALT: 47 U/L (ref 0–53)
AST: 23 U/L (ref 0–37)
Albumin: 4.5 g/dL (ref 3.5–5.2)
Alkaline Phosphatase: 52 U/L (ref 39–117)
Bilirubin, Direct: 0.1 mg/dL (ref 0.0–0.3)
Total Bilirubin: 0.6 mg/dL (ref 0.2–1.2)
Total Protein: 7.5 g/dL (ref 6.0–8.3)

## 2021-12-05 LAB — LIPASE: Lipase: 31 U/L (ref 11.0–59.0)

## 2021-12-05 MED ORDER — CHOLESTYRAMINE 4 G PO PACK
PACK | ORAL | 3 refills | Status: AC
  Filled 2021-12-05: qty 90, 90d supply, fill #0

## 2021-12-05 MED FILL — Rosuvastatin Calcium Tab 40 MG: ORAL | 30 days supply | Qty: 15 | Fill #7 | Status: AC

## 2021-12-05 NOTE — Progress Notes (Signed)
Gabriel Richards 56 y.o. 1966/09/03 [id number]  Assessment & Plan:   Encounter Diagnoses  Name Primary?   Early satiety Yes   Abdominal pain, epigastric    Heartburn    Post-cholecystectomy syndrome - diarrhea    Bile salt-induced diarrhea    Weight gain    Abnormal transaminases     My initial impression was that his weight gain may be causing this.  This is a little curious though he seems to have a good diet so I am surprised that he is gaining weight.  His triglyceride and HDL does support metabolic syndrome type issues and if glucose was fasting that was elevated as well.  So the increase in abdominal weight may be causing this but because of the signs and symptoms we will proceed with EGD to exclude other problems.  Will reassess for H. pylori at that time may need to pull him off PPI and do a stool test but will look first.  Maintain the PPI for now.  Question if some of this could be dysphagia that sounds more like early satiety.  Probably not.  Some of this does sound a little bit like biliary colic as well but the overall picture is not conclusive for 1 specific diagnosis.  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  Pending the EGD if that is unrevealing consider cross-sectional imaging with CT.  The risks and benefits as well as alternatives of endoscopic procedure(s) have been discussed and reviewed. All questions answered. The patient agrees to proceed.  Orders Placed This Encounter  Procedures   Lipase   Hepatic function panel   Ambulatory referral to Gastroenterology  Lipase and hepatic function panel were normal. Meds ordered this encounter  Medications   cholestyramine (QUESTRAN) 4 g packet    Sig: Take 1 packet mixed with fluid as directed daily with supper    Dispense:  90 packet    Refill:  3     Subjective:   Chief Complaint: Early satiety  HPI 56 year old white man who has had a 90-month history of early satiety like symptoms and fullness at the upper epigastric area after eating.  He has gained weight as you can see from the flowsheets below.  He does not drink sodas or other sugary beverages he tends to drink water.  Only about 3 or 4 beers a week.  Tends not to eat breakfast if so it is hard-boiled eggs, has lean meat and rice bowls for lunch a lot of times and does eat some [place] and other carbohydrates but not a carbohydrate heavy diet it seems.  Restaurant meals about twice a week.  No discrete dysphagia.  Will have some nocturnal heartburn which she treats with over-the-counter Pepcid which helps.  Gets about 3 hours between last meal and lying down.  He is also describing some significant epigastric pain and pressure that radiates into the back.  He is status postcholecystectomy for gallstones which really helped his problems which were a bit different than the overall complex here though he did have similar symptoms when he was sent for cholecystectomy which did improve at least for a while.  Ever since his cholecystectomy he has had liquid stools which he can control but are a nuisance. Wt Readings from Last 3 Encounters:  12/05/21 206 lb (93.4 kg)  02/13/21 186 lb (84.4  kg)  09/26/20 185 lb (83.9 kg)    Recent lab review in December his TSH was normal, TG 151, HDL 31 hemoglobin 13.8 with lower normal 14, A1c 5.3, glucose 110 with ALT 69 otherwise normal LFTs and CMET   No Known Allergies Current Meds  Medication Sig   albuterol (VENTOLIN HFA) 108 (90 Base) MCG/ACT inhaler Inhale into the lungs.   aspirin 81 MG tablet Take 81 mg by mouth daily.   Cholecalciferol 100 MCG (4000 UT) CAPS Take by mouth. Once daily   cholestyramine (QUESTRAN) 4 g packet Take 1 packet mixed with fluid as directed daily with supper   levothyroxine (SYNTHROID) 25 MCG tablet Take 25 mcg by  mouth daily.   lisinopril (ZESTRIL) 10 MG tablet TAKE 1 TABLET BY MOUTH ONCE DAILY   LORazepam (ATIVAN) 1 MG tablet Take 1 mg by mouth as needed.   omeprazole (PRILOSEC) 40 MG capsule Take 1 capsule by mouth daily.   rosuvastatin (CRESTOR) 40 MG tablet TAKE 1 TABLET BY MOUTH EVERY OTHER DAY   sildenafil (REVATIO) 20 MG tablet TAKE 1-3 TABLETS BY MOUTH DAILY AS NEEDED.   Past Medical History:  Diagnosis Date   Allergy    Arthritis    COVID-19    GERD (gastroesophageal reflux disease)    H. pylori infection    Hiatal hernia    Hx of adenomatous polyp of colon 06/03/2017   Hyperlipidemia    Hypertension    Hypothyroid    Past Surgical History:  Procedure Laterality Date   ANKLE SURGERY     APPENDECTOMY     CHOLECYSTECTOMY     UPPER GASTROINTESTINAL ENDOSCOPY  2015   H. 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   Review of Systems As per HPI  Objective:   Physical Exam @BP  (!) 150/90    Pulse (!) 102    Ht 5\' 10"  (1.778 m)    Wt 206 lb (93.4 kg)    BMI 29.56 kg/m @  General:  Well-developed, well-nourished and in no acute distress Eyes:  anicteric. Lungs: Clear to auscultation bilaterally. Heart:  S1S2, no rubs, murmurs, gallops. Abdomen:  soft, non-tender, no hepatosplenomegaly, hernia, or mass and BS+. Mildly obese w/ diastasis recti Neuro:  A&O x 3.  Psych:  appropriate mood and  Affect.   Data Reviewed:  See HPI

## 2021-12-05 NOTE — Patient Instructions (Signed)
You have been scheduled for an endoscopy. Please follow written instructions given to you at your visit today. If you use inhalers (even only as needed), please bring them with you on the day of your procedure.  Your provider has requested that you go to the basement level for lab work before leaving today. Press "B" on the elevator. The lab is located at the first door on the left as you exit the elevator.  Due to recent changes in healthcare laws, you may see the results of your imaging and laboratory studies on MyChart before your provider has had a chance to review them.  We understand that in some cases there may be results that are confusing or concerning to you. Not all laboratory results come back in the same time frame and the provider may be waiting for multiple results in order to interpret others.  Please give [place] 48 hours in order for your provider to thoroughly review all the results before contacting the office for clarification of your results.   Continue using your over the counter pepcid as needed.  We have sent the following medications to your pharmacy for you to pick up at your convenience: Cholestyramine  I appreciate the opportunity to care for you. Silvano Rusk, MD, [facility]

## 2021-12-06 ENCOUNTER — Other Ambulatory Visit (HOSPITAL_COMMUNITY): Payer: Self-pay

## 2021-12-06 ENCOUNTER — Encounter: Payer: Self-pay | Admitting: Internal Medicine

## 2021-12-06 MED ORDER — LEVOTHYROXINE SODIUM 25 MCG PO TABS
25.0000 ug | ORAL_TABLET | Freq: Every day | ORAL | 3 refills | Status: AC
  Filled 2021-12-06: qty 90, 90d supply, fill #0
  Filled 2022-03-19: qty 90, 90d supply, fill #1
  Filled 2022-07-12: qty 90, 90d supply, fill #2

## 2021-12-09 ENCOUNTER — Other Ambulatory Visit (HOSPITAL_COMMUNITY): Payer: Self-pay

## 2021-12-12 ENCOUNTER — Other Ambulatory Visit: Payer: BC Managed Care – PPO

## 2021-12-12 ENCOUNTER — Encounter: Payer: Self-pay | Admitting: Internal Medicine

## 2021-12-12 ENCOUNTER — Ambulatory Visit (AMBULATORY_SURGERY_CENTER): Payer: BC Managed Care – PPO | Admitting: Internal Medicine

## 2021-12-12 VITALS — BP 131/88 | HR 8 | Temp 99.1°F | Resp 16 | Ht 70.0 in | Wt 206.0 lb

## 2021-12-12 DIAGNOSIS — K269 Duodenal ulcer, unspecified as acute or chronic, without hemorrhage or perforation: Secondary | ICD-10-CM

## 2021-12-12 DIAGNOSIS — R12 Heartburn: Secondary | ICD-10-CM

## 2021-12-12 DIAGNOSIS — R197 Diarrhea, unspecified: Secondary | ICD-10-CM

## 2021-12-12 DIAGNOSIS — K2970 Gastritis, unspecified, without bleeding: Secondary | ICD-10-CM | POA: Diagnosis not present

## 2021-12-12 DIAGNOSIS — R6881 Early satiety: Secondary | ICD-10-CM | POA: Diagnosis present

## 2021-12-12 DIAGNOSIS — K2950 Unspecified chronic gastritis without bleeding: Secondary | ICD-10-CM

## 2021-12-12 DIAGNOSIS — K3189 Other diseases of stomach and duodenum: Secondary | ICD-10-CM

## 2021-12-12 MED ORDER — SODIUM CHLORIDE 0.9 % IV SOLN: 500.0000 mL | Freq: Once | INTRAVENOUS | Status: DC

## 2021-12-12 NOTE — Patient Instructions (Addendum)
There were many small ulcers in the upper intestine. I took biopsies of them and also of the stomach.  I am having you get a lab test to check level of gastrin - hormone that stimulates acid production. Sometimes this is very high and related to this.  Once we get pathology results will tell you what is next.  I appreciate the opportunity to care for you. Gatha Mayer, MD, [facility]  Await pathology  Please read over handout about gastritis  YOU HAD AN ENDOSCOPIC PROCEDURE TODAY AT [facility]:   Refer to the procedure report that was given to you for any specific questions about what was found during the examination.  If the procedure report does not answer your questions, please call your gastroenterologist to clarify.  If you requested that your care partner not be given the details of your procedure findings, then the procedure report has been included in a sealed envelope for you to review at your convenience later.  YOU SHOULD EXPECT: Some feelings of bloating in the abdomen. Passage of more gas than usual.  Walking can help get rid of the air that was put into your GI tract during the procedure and reduce the bloating.   Please Note:  You might notice some irritation and congestion in your nose or some drainage.  This is from the oxygen used during your procedure.  There is no need for concern and it should clear up in a day or so.  SYMPTOMS TO REPORT IMMEDIATELY:  Following upper endoscopy (EGD)  Vomiting of blood or coffee ground material  New chest pain or pain under the shoulder blades  Painful or persistently difficult swallowing  New shortness of breath  Fever of 100F or higher  Black, tarry-looking stools  For urgent or emergent issues, a gastroenterologist can be reached at any hour by calling [phone]. Do not use MyChart messaging for urgent concerns.    DIET:  We do recommend a small meal at first, but then you may proceed to your regular diet.   Drink plenty of fluids but you should avoid alcoholic beverages for 24 hours.  ACTIVITY:  You should plan to take it easy for the rest of today and you should NOT DRIVE or use heavy machinery until tomorrow (because of the sedation medicines used during the test).    FOLLOW UP: Our staff will call the number listed on your records 48-72 hours following your procedure to check on you and address any questions or concerns that you may have regarding the information given to you following your procedure. If we do not reach you, we will leave a message.  We will attempt to reach you two times.  During this call, we will ask if you have developed any symptoms of COVID 19. If you develop any symptoms (ie: fever, flu-like symptoms, shortness of breath, cough etc.) before then, please call [phone].  If you test positive for Covid 19 in the 2 weeks post procedure, please call and report this information to [place].    If any biopsies were taken you will be contacted by phone or by letter within the next 1-3 weeks.  Please call us at [phone] if you have not heard about the biopsies in 3 weeks.    SIGNATURES/CONFIDENTIALITY: You and/or your care partner have signed paperwork which will be entered into your electronic medical record.  These signatures attest to the fact that that the information above on your After Visit  Summary has been reviewed and is understood.  Full responsibility of the confidentiality of this discharge information lies with you and/or your care-partner.

## 2021-12-12 NOTE — Progress Notes (Signed)
Pt's states no medical or surgical changes since previsit or office visit. 

## 2021-12-12 NOTE — Progress Notes (Signed)
Called to room to assist during endoscopic procedure.  Patient ID and intended procedure confirmed with present staff. Received instructions for my participation in the procedure from the performing physician.  

## 2021-12-12 NOTE — Progress Notes (Signed)
Pt taken to lab via [facility] prior to D/C/

## 2021-12-12 NOTE — Progress Notes (Signed)
N.C vital signs. 

## 2021-12-12 NOTE — Progress Notes (Signed)
PT taken to PACU. Monitors in place. VSS. Report given to RN. 

## 2021-12-12 NOTE — Progress Notes (Signed)
History and Physical Interval Note:  12/12/2021 4:14 PM  Gabriel Richards  has presented today for endoscopic procedure(s), with the diagnosis of  Encounter Diagnosis  Name Primary?   Early satiety Yes  .  The various methods of evaluation and treatment have been discussed with the patient and/or family. After consideration of risks, benefits and other options for treatment, the patient has consented to  the endoscopic procedure(s).   The patient's history has been reviewed, patient examined, no change in status, stable for endoscopic procedure(s).  I have reviewed the patient's chart and labs.  Questions were answered to the patient's satisfaction.     Gatha Mayer, MD, [facility]

## 2021-12-12 NOTE — Op Note (Signed)
[facility] Patient Name: Gabriel Richards Procedure Date: 12/12/2021 3:58 PM MRN: [id number] Endoscopist: Gatha Mayer , MD Age: 56 Referring MD:  Date of Birth: [birth year] Gender: Male Account #: [id number] Procedure:                Upper GI endoscopy Indications:              Epigastric abdominal pain, Heartburn, Early satiety Medicines:                Propofol per Anesthesia, Monitored Anesthesia Care Procedure:                Pre-Anesthesia Assessment:                           - Prior to the procedure, a History and Physical                            was performed, and patient medications and                            allergies were reviewed. The patient's tolerance of                            previous anesthesia was also reviewed. The risks                            and benefits of the procedure and the sedation                            options and risks were discussed with the patient.                            All questions were answered, and informed consent                            was obtained. Prior Anticoagulants: The patient has                            taken no previous anticoagulant or antiplatelet                            agents. ASA Grade Assessment: II - A patient with                            mild systemic disease. After reviewing the risks                            and benefits, the patient was deemed in                            satisfactory condition to undergo the procedure.                           After obtaining informed consent, the endoscope was  passed under direct vision. Throughout the                            procedure, the patient's blood pressure, pulse, and                            oxygen saturations were monitored continuously. The                            GIF [id number] #[id number] was introduced through the                            mouth, and advanced to the second part of duodenum.                             The upper GI endoscopy was accomplished without                            difficulty. The patient tolerated the procedure                            well. Scope In: Scope Out: Findings:                 The gastroesophageal flap valve was visualized                            endoscopically and classified as Hill Grade III                            (minimal fold, loose to endoscope, hiatal hernia                            likely).                           Patchy mildly erythematous mucosa was found in the                            gastric body and in the gastric antrum. Biopsies                            were taken with a cold forceps for histology.                            Verification of patient identification for the                            specimen was done. Estimated blood loss was minimal.                           Many non-bleeding superficial duodenal ulcers with                            no stigmata of bleeding were found in  the second                            portion of the duodenum. The largest lesion was                            small mm in largest dimension. Biopsies were taken                            with a cold forceps for histology. Verification of                            patient identification for the specimen was done.                            Estimated blood loss was minimal.                           The cardia and gastric fundus were normal on                            retroflexion. Complications:            No immediate complications. Estimated Blood Loss:     Estimated blood loss was minimal. Impression:               - Gastroesophageal flap valve classified as Hill                            Grade III (minimal fold, loose to endoscope, hiatal                            hernia likely).                           - Erythematous mucosa in the gastric body and                            antrum. Biopsied.                            - Non-bleeding duodenal ulcers with no stigmata of                            bleeding. Biopsied. Recommendation:           - Patient has a contact number available for                            emergencies. The signs and symptoms of potential                            delayed complications were discussed with the                            patient. Return to normal activities tomorrow.  Written discharge instructions were provided to the                            patient.                           - Resume previous diet.                           - Continue present medications.                           - Await pathology results.                           - Gastrin level today Gatha Mayer, MD 12/12/2021 4:38:59 PM This report has been signed electronically.

## 2021-12-16 ENCOUNTER — Telehealth: Payer: Self-pay

## 2021-12-16 NOTE — Telephone Encounter (Signed)
Left message on answering machine. 

## 2021-12-17 LAB — GASTRIN: Gastrin: 20 pg/mL (ref <=100)

## 2022-01-11 ENCOUNTER — Other Ambulatory Visit (HOSPITAL_COMMUNITY): Payer: Self-pay

## 2022-01-15 ENCOUNTER — Other Ambulatory Visit (HOSPITAL_COMMUNITY): Payer: Self-pay

## 2022-01-15 MED ORDER — ROSUVASTATIN CALCIUM 40 MG PO TABS
ORAL_TABLET | ORAL | 3 refills | Status: AC
  Filled 2022-01-15: qty 45, 90d supply, fill #0
  Filled 2022-05-12: qty 45, 90d supply, fill #1
  Filled 2022-09-04: qty 45, 90d supply, fill #2
  Filled 2022-12-26: qty 45, 90d supply, fill #3

## 2022-02-06 ENCOUNTER — Other Ambulatory Visit (HOSPITAL_COMMUNITY): Payer: Self-pay

## 2022-02-07 ENCOUNTER — Other Ambulatory Visit (HOSPITAL_COMMUNITY): Payer: Self-pay

## 2022-03-19 ENCOUNTER — Other Ambulatory Visit (HOSPITAL_COMMUNITY): Payer: Self-pay

## 2022-04-22 ENCOUNTER — Other Ambulatory Visit (HOSPITAL_COMMUNITY): Payer: Self-pay

## 2022-04-22 MED ORDER — DEXLANSOPRAZOLE 30 MG PO CPDR
DELAYED_RELEASE_CAPSULE | ORAL | 3 refills | Status: AC
  Filled 2022-04-22: qty 90, 90d supply, fill #0

## 2022-04-26 ENCOUNTER — Other Ambulatory Visit (HOSPITAL_COMMUNITY): Payer: Self-pay

## 2022-04-28 ENCOUNTER — Other Ambulatory Visit (HOSPITAL_COMMUNITY): Payer: Self-pay

## 2022-05-12 ENCOUNTER — Other Ambulatory Visit (HOSPITAL_COMMUNITY): Payer: Self-pay

## 2022-05-20 ENCOUNTER — Other Ambulatory Visit (HOSPITAL_COMMUNITY): Payer: Self-pay

## 2022-06-04 ENCOUNTER — Other Ambulatory Visit (HOSPITAL_COMMUNITY): Payer: Self-pay

## 2022-06-05 ENCOUNTER — Other Ambulatory Visit (HOSPITAL_COMMUNITY): Payer: Self-pay

## 2022-06-06 ENCOUNTER — Other Ambulatory Visit (HOSPITAL_COMMUNITY): Payer: Self-pay

## 2022-06-09 ENCOUNTER — Other Ambulatory Visit (HOSPITAL_COMMUNITY): Payer: Self-pay

## 2022-06-13 ENCOUNTER — Other Ambulatory Visit (HOSPITAL_COMMUNITY): Payer: Self-pay

## 2022-07-12 ENCOUNTER — Other Ambulatory Visit (HOSPITAL_COMMUNITY): Payer: Self-pay

## 2022-07-22 ENCOUNTER — Other Ambulatory Visit (HOSPITAL_COMMUNITY): Payer: Self-pay

## 2022-07-23 ENCOUNTER — Encounter: Payer: Self-pay | Admitting: Internal Medicine

## 2022-08-10 IMAGING — DX DG ANKLE COMPLETE 3+V*R*
3 series · 3 of 3 positions shown · non-contrast
Comparison: 10/21/2013

CLINICAL DATA: 55-year-old male with right ankle pain.

EXAM:
RIGHT ANKLE - COMPLETE 3+ VIEW

[ankle ap]
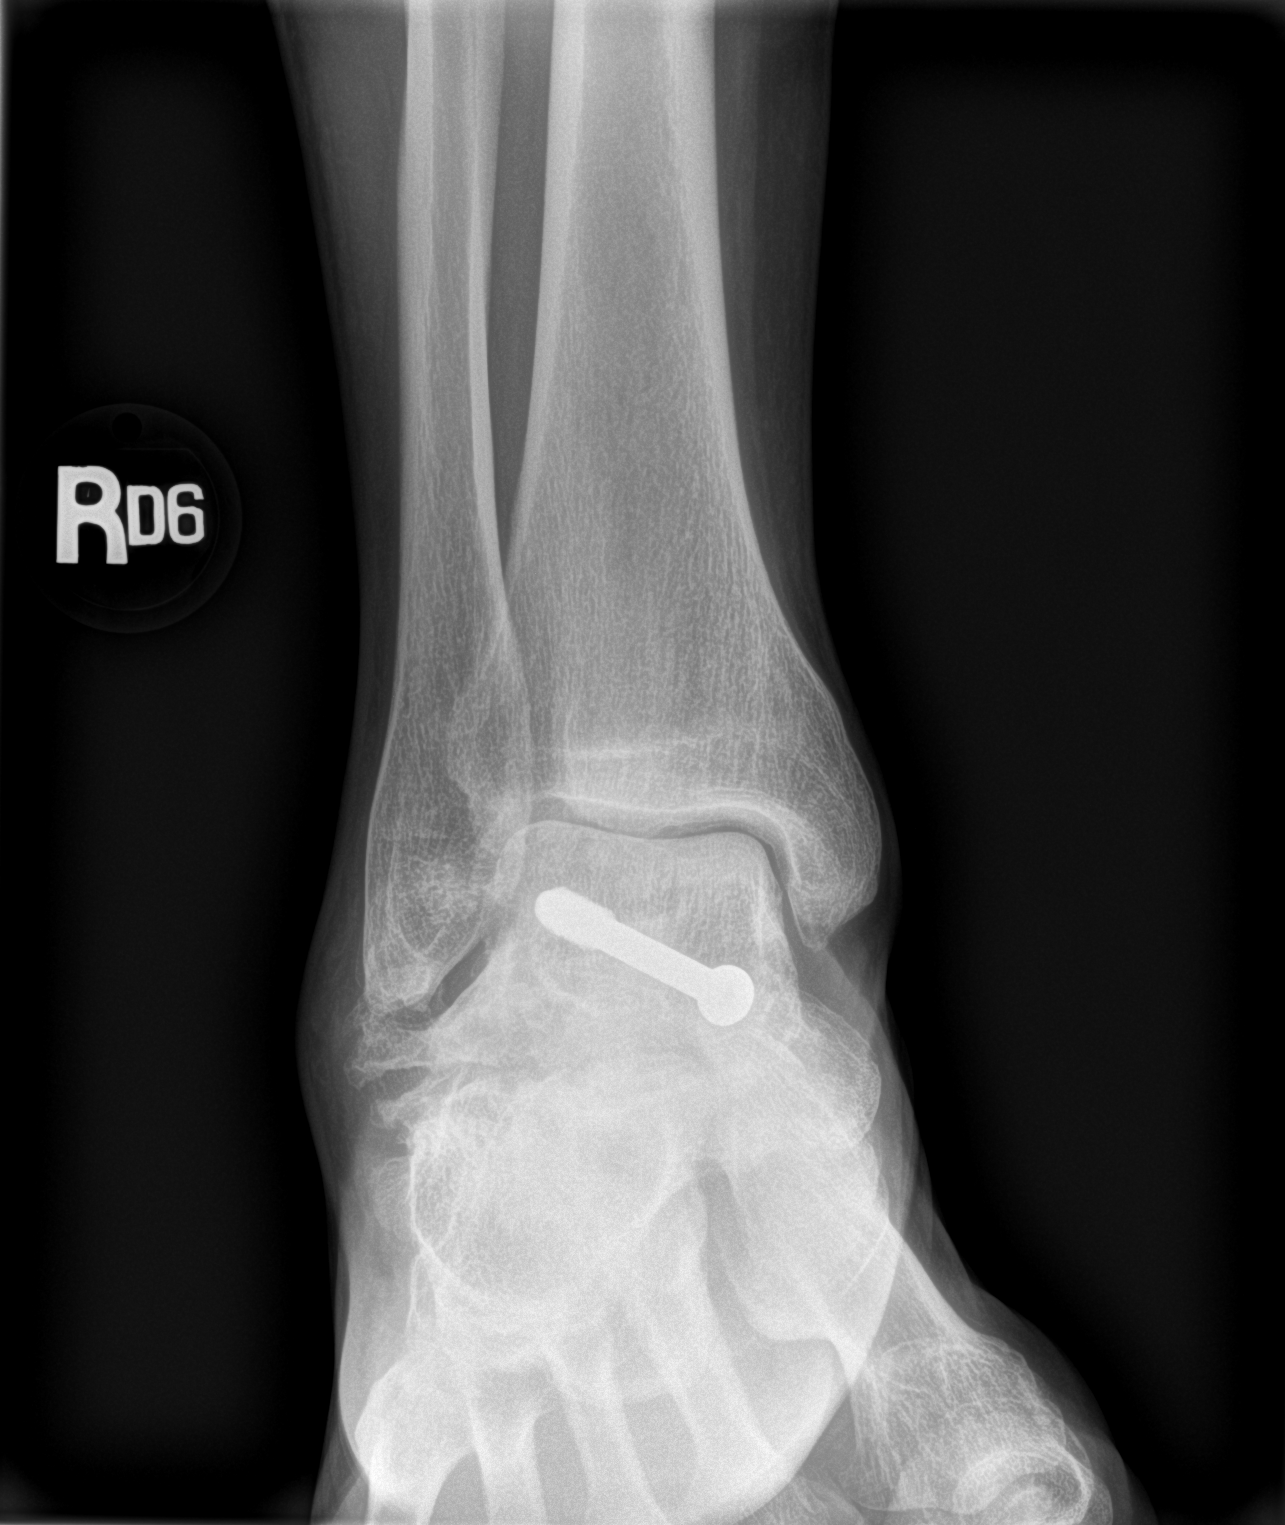

[ankle obl]
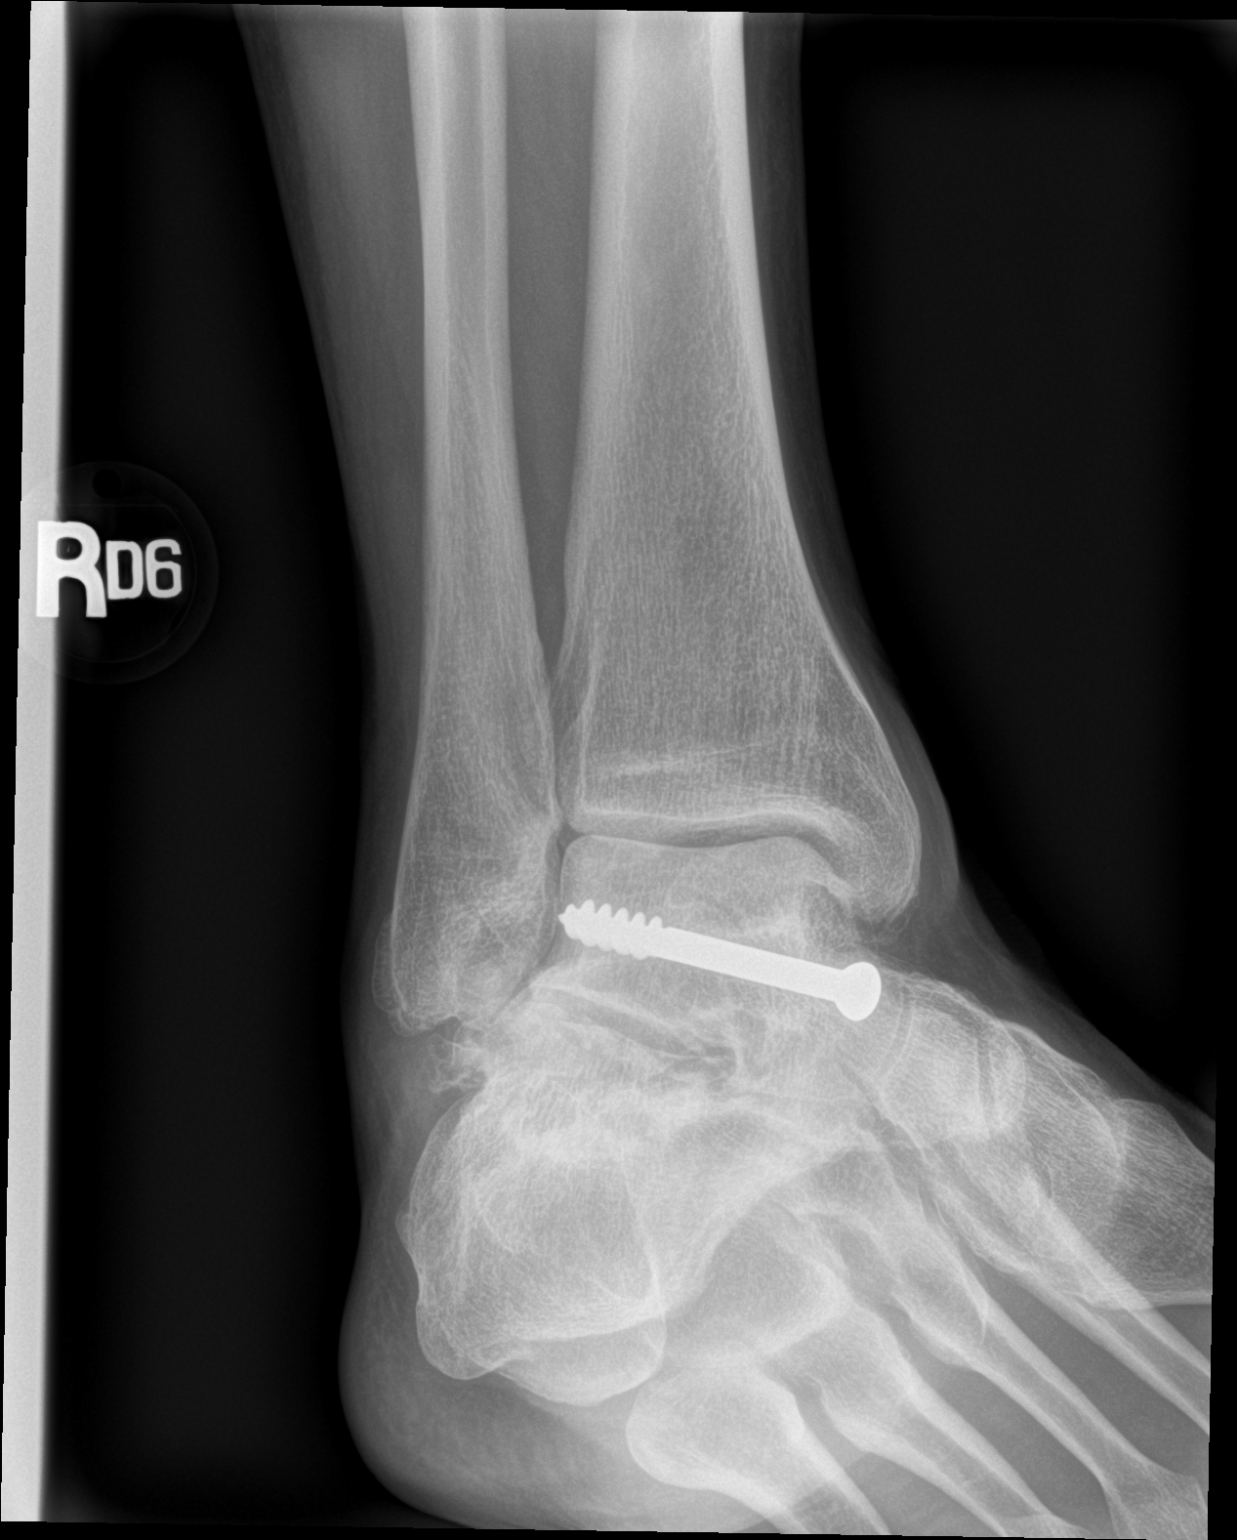

[ankle lat]
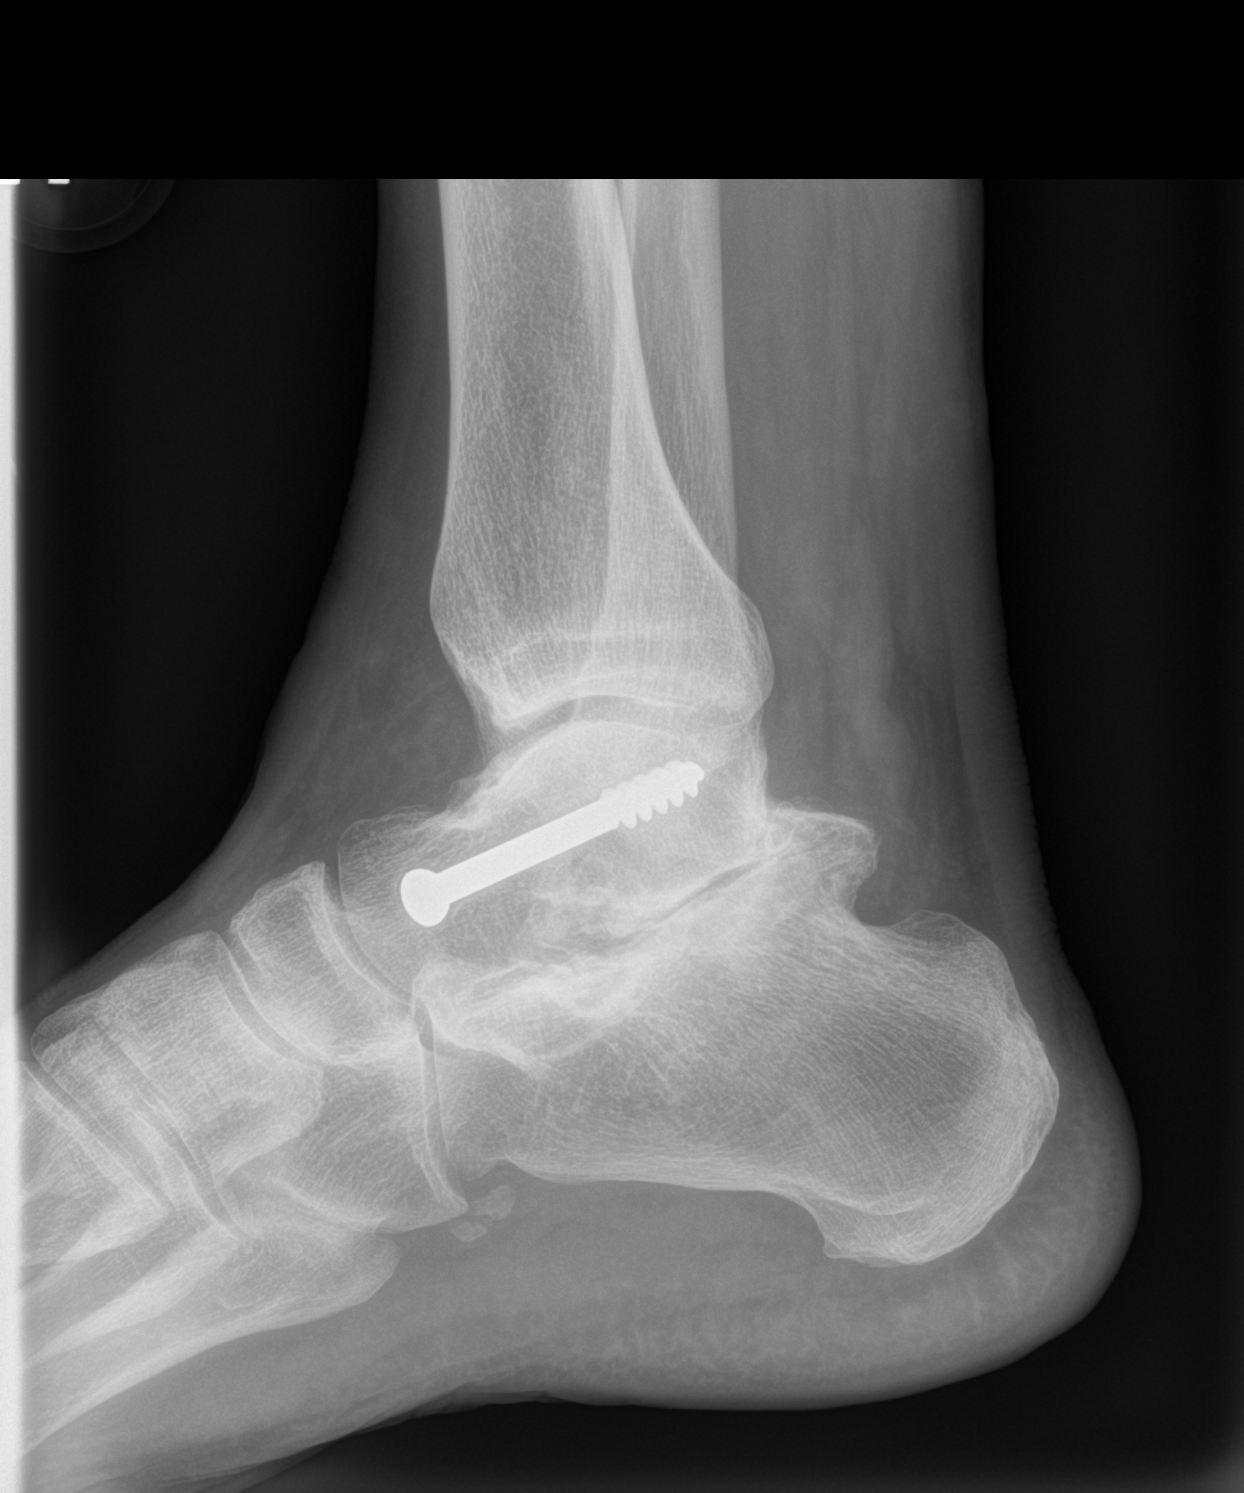

[3 of 3 positions shown; findings below may reference images not displayed]

FINDINGS: No evidence acute fracture or malalignment. Postsurgical changes
after talar screw placement without evidence of hardware loosening,
fracture, or malalignment. Similar appearing advanced degenerative
changes of the talocalcaneal joint is represented by joint space
narrowing and subchondral sclerosis with periarticular osteophyte
formation, most prominent laterally and posteriorly. Os peroneum is
again noted. Mild soft tissue prominence about the lateral
malleolus.
IMPRESSION: 1. No acute fracture or malalignment.
2. Similar appearing advanced degenerative changes of the
talocalcaneal joint.
3. Postsurgical changes after talar fixation without evidence of
complicating features.

## 2022-08-29 ENCOUNTER — Other Ambulatory Visit (HOSPITAL_COMMUNITY): Payer: Self-pay

## 2022-09-04 ENCOUNTER — Other Ambulatory Visit (HOSPITAL_COMMUNITY): Payer: Self-pay

## 2022-10-13 ENCOUNTER — Other Ambulatory Visit (HOSPITAL_COMMUNITY): Payer: Self-pay

## 2022-10-29 ENCOUNTER — Other Ambulatory Visit: Payer: Self-pay

## 2022-10-29 ENCOUNTER — Other Ambulatory Visit (HOSPITAL_COMMUNITY): Payer: Self-pay

## 2022-10-29 MED ORDER — LORAZEPAM 1 MG PO TABS
1.0000 mg | ORAL_TABLET | Freq: Every day | ORAL | 0 refills | Status: AC | PRN
  Filled 2022-10-29: qty 30, 30d supply, fill #0

## 2022-10-29 MED ORDER — LISINOPRIL 10 MG PO TABS
10.0000 mg | ORAL_TABLET | Freq: Every day | ORAL | 3 refills | Status: DC
  Filled 2022-10-29 – 2022-11-18 (×2): qty 90, 90d supply, fill #0
  Filled 2023-02-19: qty 90, 90d supply, fill #1
  Filled 2023-06-17: qty 90, 90d supply, fill #2
  Filled 2023-10-16: qty 90, 90d supply, fill #3

## 2022-10-29 MED ORDER — LEVOTHYROXINE SODIUM 25 MCG PO TABS
25.0000 ug | ORAL_TABLET | Freq: Every day | ORAL | 3 refills | Status: DC
  Filled 2022-10-29: qty 90, 90d supply, fill #0
  Filled 2023-02-19: qty 90, 90d supply, fill #1
  Filled 2023-06-17: qty 90, 90d supply, fill #2
  Filled 2023-10-16: qty 90, 90d supply, fill #3

## 2022-10-29 MED ORDER — ROSUVASTATIN CALCIUM 40 MG PO TABS
40.0000 mg | ORAL_TABLET | ORAL | 3 refills | Status: DC
  Filled 2022-10-29 – 2023-04-13 (×2): qty 45, 90d supply, fill #0
  Filled 2023-07-28: qty 45, 90d supply, fill #1

## 2022-10-29 MED ORDER — PANTOPRAZOLE SODIUM 40 MG PO TBEC
40.0000 mg | DELAYED_RELEASE_TABLET | Freq: Every day | ORAL | 0 refills | Status: DC
  Filled 2022-10-29: qty 90, 90d supply, fill #0

## 2022-11-18 ENCOUNTER — Other Ambulatory Visit (HOSPITAL_COMMUNITY): Payer: Self-pay

## 2022-11-18 ENCOUNTER — Other Ambulatory Visit: Payer: Self-pay

## 2022-12-26 ENCOUNTER — Other Ambulatory Visit (HOSPITAL_COMMUNITY): Payer: Self-pay

## 2023-02-19 ENCOUNTER — Other Ambulatory Visit: Payer: Self-pay

## 2023-02-19 ENCOUNTER — Other Ambulatory Visit (HOSPITAL_COMMUNITY): Payer: Self-pay

## 2023-02-19 MED ORDER — PANTOPRAZOLE SODIUM 40 MG PO TBEC
40.0000 mg | DELAYED_RELEASE_TABLET | Freq: Every day | ORAL | 0 refills | Status: DC
  Filled 2023-02-19: qty 90, 90d supply, fill #0

## 2023-03-12 NOTE — Progress Notes (Signed)
[name] [address] [place] [address] Phone: [phone] Subjective:   [name], am serving as a scribe for Dr. Antoine Primas.  I'm seeing this patient by the request  of:  [name]., MD  CC: Left knee pain  BJY:[id number]  Last seen May 2022 for ankle and foot pain  Updated 03/12/2023 Gabriel Richards is a 57 y.o. male coming in with complaint of lum p on back of leg. Patient states that he has a knot of proximal, medial calf for past few weeks. No pain in this area.  Patient denies any nighttime pain.  Patient also denies any fevers, chills, or any abnormal weight loss.     Past Medical History:  Diagnosis Date   Allergy    Arthritis    COVID-19    GERD (gastroesophageal reflux disease)    H. pylori infection    Hiatal hernia    Hx of adenomatous polyp of colon 06/03/2017   Hyperlipidemia    Hypertension    Hypothyroid    Past Surgical History:  Procedure Laterality Date   ANKLE SURGERY     APPENDECTOMY     CHOLECYSTECTOMY     UPPER GASTROINTESTINAL ENDOSCOPY  2015   H. pylori gastritis   Social History   Socioeconomic History   Marital status: Married    Spouse name: Not on file   Number of children: Not on file   Years of education: Not on file   Highest education level: Not on file  Occupational History   Not on file  Tobacco Use   Smoking status: Former   Smokeless tobacco: Never  Vaping Use   Vaping Use: Never used  Substance and Sexual Activity   Alcohol use: Yes    Alcohol/week: 4.0 standard drinks of alcohol    Types: 4 Cans of beer per week   Drug use: No   Sexual activity: Yes    Partners: Female  Other Topics Concern   Not on file  Social History Narrative   Married to [place], 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   About 4 beers a week former smoker no tobacco or drug use at this time   Social Determinants of Corporate investment banker Strain: Not on file  Food Insecurity: Not on file   Transportation Needs: Not on file  Physical Activity: Not on file  Stress: Not on file  Social Connections: Not on file   No Known Allergies Family History  Problem Relation Age of Onset   Hyperlipidemia Father    Heart disease Father    Hypertension Father    Diabetes Father    Hypertension Brother    Hyperlipidemia Brother    Early death Neg Hx    Cancer Neg Hx    COPD Neg Hx    Drug abuse Neg Hx    Alcohol abuse Neg Hx    Kidney disease Neg Hx    Stroke Neg Hx    Colon cancer Neg Hx    Esophageal cancer Neg Hx    Rectal cancer Neg Hx    Stomach cancer Neg Hx     Current Outpatient Medications (Endocrine & Metabolic):    levothyroxine (SYNTHROID) 25 MCG tablet, Take 25 mcg by mouth daily.   levothyroxine (SYNTHROID) 25 MCG tablet, TAKE 1 TABLET BY MOUTH ONCE DAILY   levothyroxine (SYNTHROID) 25 MCG tablet, Take 1 tablet (25 mcg total) by mouth daily.  Current Outpatient Medications (Cardiovascular):  cholestyramine (QUESTRAN) 4 g packet, Take 1 packet mixed with fluid as directed daily with supper   lisinopril (ZESTRIL) 10 MG tablet, Take 1 tablet (10 mg total) by mouth daily.   rosuvastatin (CRESTOR) 40 MG tablet, Take 1 tablet by mouth every other day.   rosuvastatin (CRESTOR) 40 MG tablet, Take 1 tablet (40 mg total) by mouth every other day.   sildenafil (REVATIO) 20 MG tablet, TAKE 1-3 TABLETS BY MOUTH DAILY AS NEEDED.   rosuvastatin (CRESTOR) 40 MG tablet, TAKE 1 TABLET BY MOUTH EVERY OTHER DAY  Current Outpatient Medications (Respiratory):    albuterol (VENTOLIN HFA) 108 (90 Base) MCG/ACT inhaler, Inhale into the lungs.  Current Outpatient Medications (Analgesics):    aspirin 81 MG tablet, Take 81 mg by mouth daily.   Current Outpatient Medications (Other):    Cholecalciferol 100 MCG (4000 UT) CAPS, Take by mouth. Once daily   Dexlansoprazole 30 MG capsule DR, Take 1 capsule by mouth daily   LORazepam (ATIVAN) 1 MG tablet, Take 1 mg by mouth as needed.    LORazepam (ATIVAN) 1 MG tablet, Take 1 tablet (1 mg total) by mouth daily as needed (shortness of breath and anxiety)   pantoprazole (PROTONIX) 40 MG tablet, Take 1 tablet (40 mg total) by mouth daily.    Objective  Blood pressure 128/86, pulse 89, height 5\' 10"  (1.778 m), weight 191 lb (86.6 kg), SpO2 98 %.   General: No apparent distress alert and oriented x3 mood and affect normal, dressed appropriately.  HEENT: Pupils equal, extraocular movements intact  Respiratory: Patient's speak in full sentences and does not appear short of breath  Cardiovascular: No lower extremity edema, non tender, no erythema  Left calf does have a fullness noted at the proximal aspect of the medial gastroc head.  Nontender on exam.  Freely movable.  Difficult to assess if it goes into the knee joint on exam.  Knee very mild crepitus but nontender.  Limited muscular skeletal ultrasound was performed and interpreted by Antoine Primas, M   Limited ultrasound shows Patient does have a hypoechoic mass noted with some striations noted in the gastroc area.  Abnormal vascularity surrounding the area and a couple areas that do appear proximally to have vascularity to this mass.  Does not seem to be encompassing the gastrocnemius itself and does not seem to have any communication to the knee. Impression: Abnormal soft tissue mass of the medial gastroc head proximately with abnormal blood flow    Impression and Recommendations:     The above documentation has been reviewed and is accurate and complete Judi Saa, DO

## 2023-03-13 ENCOUNTER — Ambulatory Visit: Payer: BC Managed Care – PPO | Admitting: Family Medicine

## 2023-03-13 ENCOUNTER — Ambulatory Visit (INDEPENDENT_AMBULATORY_CARE_PROVIDER_SITE_OTHER): Payer: BC Managed Care – PPO

## 2023-03-13 ENCOUNTER — Other Ambulatory Visit: Payer: Self-pay

## 2023-03-13 ENCOUNTER — Encounter: Payer: Self-pay | Admitting: Family Medicine

## 2023-03-13 VITALS — BP 128/86 | HR 89 | Ht 70.0 in | Wt 191.0 lb

## 2023-03-13 DIAGNOSIS — R2242 Localized swelling, mass and lump, left lower limb: Secondary | ICD-10-CM | POA: Insufficient documentation

## 2023-03-13 DIAGNOSIS — M25562 Pain in left knee: Secondary | ICD-10-CM

## 2023-03-13 NOTE — Assessment & Plan Note (Signed)
Patient does have a mass noted on ultrasound.  Initially thought it would be a potential Baker's cyst but was too low.  On ultrasound does not seem to have any communication with the knee joint itself.  Is still within the differential is a ganglion cyst but there was some abnormal vascularity in the area.  Concerned that this could be potentially contributing.  Discussed need for further evaluation.  Will get x-rays to further evaluate and we will order an MRI to further evaluate the mass and the echogenicity of it.  Depending on results we will discuss treatment and plan thereafter.

## 2023-03-13 NOTE — Patient Instructions (Signed)
MRI L knee  Xray L knee today We will be in touch

## 2023-03-15 ENCOUNTER — Encounter: Payer: Self-pay | Admitting: Family Medicine

## 2023-03-15 ENCOUNTER — Ambulatory Visit (INDEPENDENT_AMBULATORY_CARE_PROVIDER_SITE_OTHER): Payer: BC Managed Care – PPO

## 2023-03-15 DIAGNOSIS — M25562 Pain in left knee: Secondary | ICD-10-CM | POA: Diagnosis not present

## 2023-03-16 ENCOUNTER — Other Ambulatory Visit: Payer: Self-pay

## 2023-03-16 DIAGNOSIS — R2242 Localized swelling, mass and lump, left lower limb: Secondary | ICD-10-CM

## 2023-03-18 ENCOUNTER — Ambulatory Visit
Admission: RE | Admit: 2023-03-18 | Discharge: 2023-03-18 | Disposition: A | Discharge status: Home / Self Care | Payer: BC Managed Care – PPO | Source: Ambulatory Visit | Attending: Family Medicine | Admitting: Family Medicine

## 2023-03-18 DIAGNOSIS — R2242 Localized swelling, mass and lump, left lower limb: Secondary | ICD-10-CM

## 2023-03-18 MED ORDER — GADOPICLENOL 0.5 MMOL/ML IV SOLN
9.0000 mL | Freq: Once | INTRAVENOUS | Status: AC | PRN
  Administered 2023-03-18: 9 mL via INTRAVENOUS

## 2023-03-19 ENCOUNTER — Other Ambulatory Visit: Payer: Self-pay

## 2023-03-19 ENCOUNTER — Other Ambulatory Visit: Payer: Self-pay | Admitting: Family Medicine

## 2023-03-19 DIAGNOSIS — R2242 Localized swelling, mass and lump, left lower limb: Secondary | ICD-10-CM

## 2023-03-19 NOTE — Progress Notes (Signed)
Referral placed.

## 2023-04-13 ENCOUNTER — Other Ambulatory Visit (HOSPITAL_COMMUNITY): Payer: Self-pay

## 2023-04-13 ENCOUNTER — Other Ambulatory Visit: Payer: Self-pay

## 2023-06-17 ENCOUNTER — Other Ambulatory Visit (HOSPITAL_COMMUNITY): Payer: Self-pay

## 2023-06-18 ENCOUNTER — Other Ambulatory Visit (HOSPITAL_COMMUNITY): Payer: Self-pay

## 2023-06-18 ENCOUNTER — Other Ambulatory Visit: Payer: Self-pay

## 2023-06-18 ENCOUNTER — Encounter (HOSPITAL_COMMUNITY): Payer: Self-pay

## 2023-06-18 MED ORDER — PANTOPRAZOLE SODIUM 40 MG PO TBEC
40.0000 mg | DELAYED_RELEASE_TABLET | Freq: Every day | ORAL | 0 refills | Status: DC
  Filled 2023-06-18: qty 90, 90d supply, fill #0

## 2023-06-19 ENCOUNTER — Other Ambulatory Visit: Payer: Self-pay

## 2023-06-22 ENCOUNTER — Other Ambulatory Visit (HOSPITAL_COMMUNITY): Payer: Self-pay

## 2023-06-23 ENCOUNTER — Other Ambulatory Visit (HOSPITAL_COMMUNITY): Payer: Self-pay

## 2023-07-08 ENCOUNTER — Other Ambulatory Visit (HOSPITAL_COMMUNITY): Payer: Self-pay

## 2023-07-28 ENCOUNTER — Other Ambulatory Visit (HOSPITAL_COMMUNITY): Payer: Self-pay

## 2023-08-10 ENCOUNTER — Other Ambulatory Visit (HOSPITAL_COMMUNITY): Payer: Self-pay

## 2023-08-28 ENCOUNTER — Other Ambulatory Visit (HOSPITAL_COMMUNITY): Payer: Self-pay

## 2023-09-17 ENCOUNTER — Ambulatory Visit: Payer: BC Managed Care – PPO | Admitting: Family Medicine

## 2023-09-17 ENCOUNTER — Other Ambulatory Visit: Payer: Self-pay

## 2023-09-17 ENCOUNTER — Encounter: Payer: Self-pay | Admitting: Family Medicine

## 2023-09-17 ENCOUNTER — Ambulatory Visit: Payer: BC Managed Care – PPO

## 2023-09-17 VITALS — BP 126/86 | HR 80 | Ht 70.0 in | Wt 191.0 lb

## 2023-09-17 DIAGNOSIS — M542 Cervicalgia: Secondary | ICD-10-CM | POA: Diagnosis not present

## 2023-09-17 DIAGNOSIS — R29898 Other symptoms and signs involving the musculoskeletal system: Secondary | ICD-10-CM | POA: Diagnosis not present

## 2023-09-17 DIAGNOSIS — M79602 Pain in left arm: Secondary | ICD-10-CM | POA: Diagnosis not present

## 2023-09-17 NOTE — Patient Instructions (Addendum)
Cervical spine xray today [facility] will call you Stay active See me in 7-8 weeks

## 2023-09-17 NOTE — Assessment & Plan Note (Signed)
Patient does have atrophy of the brachial radialis and what appears to be most most likely one of the profundus muscles as well and the flexor atrophy of the forearm.  Concern noted because patient does have weakness of the shoulder girdle past 90 degrees bilaterally.  Will get a cervical neck x-ray.  We do feel that a nerve conduction test is needed secondary to the severity of the unilateral weakness.  Depending on the findings we will see if any other significant workup such as laboratory workup is needed.  Past medical history is significant for a desmoid fibromatosis.  Depending on findings of these then we will discuss further medical management.

## 2023-09-17 NOTE — Progress Notes (Signed)
[name] [address] [place] [address] Phone: [phone] Subjective:   Gabriel Richards, am serving as a scribe for Dr. Antoine Primas.  I'm seeing this patient by the request  of:  Gabriel Safe., MD  CC: Left arm pain and weakness  BJY:[id number]  Gabriel Richards is a 57 y.o. male coming in with complaint of L arm. Patient states that his arm has been tingling for past year. Tingling starts at distal bicep and radiates into the proximal forearm. Patient notes lack of definition in brachioradialis muscle in L as compared to R. Does not feel that he is weaker on L side with elbow flexion.  Patient feels like he has not made significant differences in anything.  Has been going on for some time slowly.  Not affecting daily activities as of yet but is noticing weakness when working out.       Past Medical History:  Diagnosis Date   Allergy    Arthritis    COVID-19    GERD (gastroesophageal reflux disease)    H. pylori infection    Hiatal hernia    Hx of adenomatous polyp of colon 06/03/2017   Hyperlipidemia    Hypertension    Hypothyroid    Past Surgical History:  Procedure Laterality Date   ANKLE SURGERY     APPENDECTOMY     CHOLECYSTECTOMY     UPPER GASTROINTESTINAL ENDOSCOPY  2015   H. pylori gastritis   Social History   Socioeconomic History   Marital status: Married    Spouse name: Not on file   Number of children: Not on file   Years of education: Not on file   Highest education level: Not on file  Occupational History   Not on file  Tobacco Use   Smoking status: Former   Smokeless tobacco: Never  Vaping Use   Vaping status: Never Used  Substance and Sexual Activity   Alcohol use: Yes    Alcohol/week: 4.0 standard drinks of alcohol    Types: 4 Cans of beer per week   Drug use: No   Sexual activity: Yes    Partners: Female  Other Topics Concern   Not on file  Social History Narrative   Married to [name], 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   About 4 beers a week former smoker no tobacco or drug use at this time   Social Determinants of Corporate investment banker Strain: Not on file  Food Insecurity: Not on file  Transportation Needs: Not on file  Physical Activity: Not on file  Stress: Not on file  Social Connections: Unknown (03/19/2023)   Received from [facility], [facility]   Social Network    Social Network: Not on file   No Known Allergies Family History  Problem Relation Age of Onset   Hyperlipidemia Father    Heart disease Father    Hypertension Father    Diabetes Father    Hypertension Brother    Hyperlipidemia Brother    Early death Neg Hx    Cancer Neg Hx    COPD Neg Hx    Drug abuse Neg Hx    Alcohol abuse Neg Hx    Kidney disease Neg Hx    Stroke Neg Hx    Colon cancer Neg Hx    Esophageal cancer Neg Hx    Rectal cancer Neg Hx    Stomach cancer Neg Hx     Current Outpatient  Medications (Endocrine & Metabolic):    levothyroxine (SYNTHROID) 25 MCG tablet, Take 25 mcg by mouth daily.   levothyroxine (SYNTHROID) 25 MCG tablet, TAKE 1 TABLET BY MOUTH ONCE DAILY   levothyroxine (SYNTHROID) 25 MCG tablet, Take 1 tablet (25 mcg total) by mouth daily.  Current Outpatient Medications (Cardiovascular):    cholestyramine (QUESTRAN) 4 g packet, Take 1 packet mixed with fluid as directed daily with supper   lisinopril (ZESTRIL) 10 MG tablet, Take 1 tablet (10 mg total) by mouth daily.   rosuvastatin (CRESTOR) 40 MG tablet, TAKE 1 TABLET BY MOUTH EVERY OTHER DAY   rosuvastatin (CRESTOR) 40 MG tablet, Take 1 tablet by mouth every other day.   rosuvastatin (CRESTOR) 40 MG tablet, Take 1 tablet (40 mg total) by mouth every other day.   sildenafil (REVATIO) 20 MG tablet, TAKE 1-3 TABLETS BY MOUTH DAILY AS NEEDED.  Current Outpatient Medications (Respiratory):    albuterol (VENTOLIN HFA) 108 (90 Base) MCG/ACT inhaler, Inhale into the lungs.  Current Outpatient Medications  (Analgesics):    aspirin 81 MG tablet, Take 81 mg by mouth daily.   Current Outpatient Medications (Other):    Cholecalciferol 100 MCG (4000 UT) CAPS, Take by mouth. Once daily   Dexlansoprazole 30 MG capsule DR, Take 1 capsule by mouth daily   LORazepam (ATIVAN) 1 MG tablet, Take 1 mg by mouth as needed.   LORazepam (ATIVAN) 1 MG tablet, Take 1 tablet (1 mg total) by mouth daily as needed (shortness of breath and anxiety)   pantoprazole (PROTONIX) 40 MG tablet, Take 1 tablet (40 mg total) by mouth daily.   Reviewed prior external information including notes and imaging from  primary care provider As well as notes that were available from care everywhere and other healthcare systems.  Past medical history, social, surgical and family history all reviewed in electronic medical record.  No pertanent information unless stated regarding to the chief complaint.   Review of Systems:  No headache, visual changes, nausea, vomiting, diarrhea, constipation, dizziness, abdominal pain, skin rash, fevers, chills, night sweats, weight loss, swollen lymph nodes, body aches, joint swelling, chest pain, shortness of breath, mood changes. POSITIVE muscle aches  Objective  Blood pressure 126/86, pulse 80, height 5\' 10"  (1.778 m), weight 191 lb (86.6 kg), SpO2 98%.   General: No apparent distress alert and oriented x3 mood and affect normal, dressed appropriately.  HEENT: Pupils equal, extraocular movements intact  Respiratory: Patient's speak in full sentences and does not appear short of breath  Cardiovascular: No lower extremity edema, non tender, no erythema  On examination patient does have some atrophy noted just medial to the radial side of the elbow on the ventral side.  This is on the left elbow.  Patient on strength testing has good strength of the hand.  Negative Tinel's noted.  Mild pain over the anterior interosseous nerve.  Patient does have weakness of the shoulder girdle when trying to get  elbows to 90 degrees of the shoulder.  Fairly significant for patient's muscle size.  Neck exam has some limited sidebending bilaterally and extension.  Limited muscular skeletal ultrasound was performed and interpreted by Antoine Primas, M  Limited ultrasound of patient's elbow area appears that the pronator tear is questionable on the flexor carpi muscular group does have some atrophy with some mild fatty deposits.  No masses appreciated. Impression: Consistent with long-term nerve denervation and no muscle atrophy    Impression and Recommendations:      The above documentation  has been reviewed and is accurate and complete Judi Saa, DO

## 2023-09-21 ENCOUNTER — Encounter: Payer: Self-pay | Admitting: Neurology

## 2023-09-21 ENCOUNTER — Other Ambulatory Visit: Payer: Self-pay

## 2023-09-21 DIAGNOSIS — R202 Paresthesia of skin: Secondary | ICD-10-CM

## 2023-10-16 ENCOUNTER — Other Ambulatory Visit (HOSPITAL_COMMUNITY): Payer: Self-pay

## 2023-10-19 ENCOUNTER — Other Ambulatory Visit (HOSPITAL_COMMUNITY): Payer: Self-pay

## 2023-10-19 MED ORDER — PANTOPRAZOLE SODIUM 40 MG PO TBEC
40.0000 mg | DELAYED_RELEASE_TABLET | Freq: Every day | ORAL | 0 refills | Status: DC
  Filled 2023-10-19: qty 90, 90d supply, fill #0

## 2023-10-22 ENCOUNTER — Ambulatory Visit: Payer: BC Managed Care – PPO | Admitting: Neurology

## 2023-10-22 ENCOUNTER — Other Ambulatory Visit: Payer: Self-pay | Admitting: Family Medicine

## 2023-10-22 DIAGNOSIS — R202 Paresthesia of skin: Secondary | ICD-10-CM | POA: Diagnosis not present

## 2023-10-22 DIAGNOSIS — M542 Cervicalgia: Secondary | ICD-10-CM

## 2023-10-22 DIAGNOSIS — M5412 Radiculopathy, cervical region: Secondary | ICD-10-CM

## 2023-10-22 NOTE — Procedures (Signed)
  [facility]  [address], [address]  [place], [place] [address] Tel: [phone] Fax: [phone] Test Date:  10/22/2023  Patient: Gabriel Richards DOB: 02/22/1966 Physician: Nita Sickle, DO  Sex: Male Height: 5\' 10"  Ref Phys: Antoine Primas, DO  ID#: [id number]   Technician:    History: This is a 57 year old man referred for evaluation of left arm weakness and atrophy.  NCV & EMG Findings: Extensive electrodiagnostic testing of the left upper extremity shows:  Left median sensory response shows mildly prolonged latency.  Left ulnar and radial sensory responses are within normal limits. Left median and ulnar motor responses are within normal limits. Active on chronic motor axon loss changes are seen affecting the left C5 myotome.  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 Left median neuropathy at or distal to the wrist, consistent with a clinical diagnosis of carpal tunnel syndrome.  Overall, these findings are mild in degree electrically.   ___________________________ Nita Sickle, DO    Nerve Conduction Studies   Stim Site NR Peak (ms) Norm Peak (ms) O-P Amp (V) Norm O-P Amp  Left Median Anti Sensory (2nd Digit)  32 C  Wrist    *3.8 <3.6 17.5 >15  Left Radial Anti Sensory (Base 1st Digit)  32 C  Wrist    2.1 <2.7 23.2 >14  Left Ulnar Anti Sensory (5th Digit)  32 C  Wrist    3.1 <3.1 14.4 >10     Stim Site NR Onset (ms) Norm Onset (ms) O-P Amp (mV) Norm O-P Amp Site1 Site2 Delta-0 (ms) Dist (cm) Vel (m/s) Norm Vel (m/s)  Left Median Motor (Abd Poll Brev)  32 C  Wrist    3.9 <4.0 9.6 >6 Elbow Wrist 5.5 29.0 53 >50  Elbow    9.4  8.6         Left Ulnar Motor (Abd Dig Minimi)  32 C  Wrist    2.5 <3.1 10.5 >7 B Elbow Wrist 3.8 24.0 63 >50  B Elbow    6.3  8.3  A Elbow B Elbow 1.5 10.0 67 >50  A Elbow    7.8  7.8          Electromyography   Side Muscle Ins.Act Fibs Fasc Recrt Amp Dur  Poly Activation Comment  Left 1stDorInt Nml Nml Nml Nml Nml Nml Nml Nml N/A  Left Abd Poll Brev Nml Nml Nml Nml Nml Nml Nml Nml N/A  Left PronatorTeres Nml Nml Nml Nml Nml Nml Nml Nml N/A  Left Biceps Nml *1+ Nml *2- *1+ *1+ *1+ Nml N/A  Left Triceps Nml Nml Nml Nml Nml Nml Nml Nml N/A  Left Deltoid Nml *1+ Nml *3- *1+ *1+ *1+ Nml *ATR  Left BrachioRad Nml *2+ Nml *3- *1+ *1+ *1+ Nml *ATR  Left Infraspinatus Nml *1+ Nml *SMU *1+ *1+ *1+ Nml N/A  Left Cervical Parasp Low Nml *1+ Nml Nml *- *- *- Nml N/A      Waveforms:

## 2023-11-01 ENCOUNTER — Ambulatory Visit: Payer: BC Managed Care – PPO

## 2023-11-01 DIAGNOSIS — M542 Cervicalgia: Secondary | ICD-10-CM

## 2023-11-02 NOTE — Progress Notes (Signed)
[name] [address] [place] [address] Phone: [phone] Subjective:   Gabriel Richards, am serving as a scribe for Dr. Antoine Primas.  I'm seeing this patient by the request  of:  [name]., MD  CC: bilateral shoulder pain   [id number]  09/17/2023 Patient does have atrophy of the brachial radialis and what appears to be most most likely one of the profundus muscles as well and the flexor atrophy of the forearm.  Concern noted because patient does have weakness of the shoulder girdle past 90 degrees bilaterally.  Will get a cervical neck x-ray.  We do feel that a nerve conduction test is needed secondary to the severity of the unilateral weakness.  Depending on the findings we will see if any other significant workup such as laboratory workup is needed.  Past medical history is significant for a desmoid fibromatosis.  Depending on findings of these then we will discuss further medical management.      Update 11/06/2023 Gabriel Richards is a 57 y.o. male coming in with complaint of B shoulder pain. EMG 10/22/2023.Patient states that he is having L sided neck and forearm pain.    MRI Cervical 11/01/2023 IMPRESSION: 1. Motion degraded examination. 2. Cervical spondylosis as outlined within the body of the report. 3. At C3-C4, advanced disc degeneration. A posterior disc osteophyte complex contributes to moderate to moderately severe spinal canal stenosis. Multifactorial severe bilateral neural foraminal narrowing. 4. At C4-C5, advanced disc degeneration. A posterior disc osteophyte complex contributes to moderate to moderately severe spinal canal stenosis. Multifactorial severe bilateral neural foraminal narrowing. 5. At C5-C6, there is advanced disc degeneration. A posterior disc osteophyte complex contributes to multifactorial moderate to moderately severe spinal canal stenosis. Multifactorial bilateral neural foraminal narrowing  (moderate right, severe left). 6. No more than mild spinal canal narrowing at the remaining cervical levels. 7. Multifactorial severe bilateral neural foraminal narrowing at C6-C7. 8. Mild degenerative endplate edema at O5-D6, C4-C5, C5-C6 and C6-C7.    Past Medical History:  Diagnosis Date   Allergy    Arthritis    COVID-19    GERD (gastroesophageal reflux disease)    H. pylori infection    Hiatal hernia    Hx of adenomatous polyp of colon 06/03/2017   Hyperlipidemia    Hypertension    Hypothyroid    Past Surgical History:  Procedure Laterality Date   ANKLE SURGERY     APPENDECTOMY     CHOLECYSTECTOMY     UPPER GASTROINTESTINAL ENDOSCOPY  2015   H. pylori gastritis   Social History   Socioeconomic History   Marital status: Married    Spouse name: Not on file   Number of children: Not on file   Years of education: Not on file   Highest education level: Not on file  Occupational History   Not on file  Tobacco Use   Smoking status: Former   Smokeless tobacco: Never  Vaping Use   Vaping status: Never Used  Substance and Sexual Activity   Alcohol use: Yes    Alcohol/week: 4.0 standard drinks of alcohol    Types: 4 Cans of beer per week   Drug use: No   Sexual activity: Yes    Partners: Female  Other Topics Concern   Not on file  Social History Narrative   Married to [place], 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   About 4 beers a week former smoker no tobacco or drug use at this  time   Social Drivers of Corporate investment banker Strain: Not on file  Food Insecurity: Not on file  Transportation Needs: Not on file  Physical Activity: Not on file  Stress: Not on file  Social Connections: Unknown (03/19/2023)   Received from [facility], [facility]   Social Network    Social Network: Not on file   No Known Allergies Family History  Problem Relation Age of Onset   Hyperlipidemia Father    Heart disease Father    Hypertension Father    Diabetes Father     Hypertension Brother    Hyperlipidemia Brother    Early death Neg Hx    Cancer Neg Hx    COPD Neg Hx    Drug abuse Neg Hx    Alcohol abuse Neg Hx    Kidney disease Neg Hx    Stroke Neg Hx    Colon cancer Neg Hx    Esophageal cancer Neg Hx    Rectal cancer Neg Hx    Stomach cancer Neg Hx     Current Outpatient Medications (Endocrine & Metabolic):    levothyroxine (SYNTHROID) 25 MCG tablet, Take 25 mcg by mouth daily.   levothyroxine (SYNTHROID) 25 MCG tablet, TAKE 1 TABLET BY MOUTH ONCE DAILY   levothyroxine (SYNTHROID) 25 MCG tablet, Take 1 tablet (25 mcg total) by mouth daily.  Current Outpatient Medications (Cardiovascular):    cholestyramine (QUESTRAN) 4 g packet, Take 1 packet mixed with fluid as directed daily with supper   lisinopril (ZESTRIL) 10 MG tablet, Take 1 tablet (10 mg total) by mouth daily.   rosuvastatin (CRESTOR) 40 MG tablet, Take 1 tablet by mouth every other day.   rosuvastatin (CRESTOR) 40 MG tablet, Take 1 tablet (40 mg total) by mouth every other day.   sildenafil (REVATIO) 20 MG tablet, TAKE 1-3 TABLETS BY MOUTH DAILY AS NEEDED.   rosuvastatin (CRESTOR) 40 MG tablet, TAKE 1 TABLET BY MOUTH EVERY OTHER DAY  Current Outpatient Medications (Respiratory):    albuterol (VENTOLIN HFA) 108 (90 Base) MCG/ACT inhaler, Inhale into the lungs.  Current Outpatient Medications (Analgesics):    aspirin 81 MG tablet, Take 81 mg by mouth daily.   Current Outpatient Medications (Other):    Cholecalciferol 100 MCG (4000 UT) CAPS, Take by mouth. Once daily   Dexlansoprazole 30 MG capsule DR, Take 1 capsule by mouth daily   LORazepam (ATIVAN) 1 MG tablet, Take 1 mg by mouth as needed.   LORazepam (ATIVAN) 1 MG tablet, Take 1 tablet (1 mg total) by mouth daily as needed (shortness of breath and anxiety)   pantoprazole (PROTONIX) 40 MG tablet, Take 1 tablet (40 mg total) by mouth daily.   Review of Systems:  No headache, visual changes, nausea, vomiting, diarrhea,  constipation, dizziness, abdominal pain, skin rash, fevers, chills, night sweats, weight loss, swollen lymph nodes, body aches, joint swelling, chest pain, shortness of breath, mood changes. POSITIVE muscle aches  Objective  Blood pressure (!) 138/92, pulse 69, height 5\' 10"  (1.778 m), weight 199 lb (90.3 kg), SpO2 98%.   General: No apparent distress alert and oriented x3 mood and affect normal, dressed appropriately.  HEENT: Pupils equal, extraocular movements intact  Respiratory: Patient's speak in full sentences and does not appear short of breath  Cardiovascular: No lower extremity edema, non tender, no erythema  Neck does have loss of lordosis  tightness with extension  Tightness noted.     Impression and Recommendations:     The above  documentation has been reviewed and is accurate and complete Judi Saa, DO

## 2023-11-06 ENCOUNTER — Other Ambulatory Visit: Payer: Self-pay

## 2023-11-06 ENCOUNTER — Ambulatory Visit: Payer: BC Managed Care – PPO | Admitting: Family Medicine

## 2023-11-06 ENCOUNTER — Encounter: Payer: Self-pay | Admitting: Family Medicine

## 2023-11-06 VITALS — BP 138/92 | HR 69 | Ht 70.0 in | Wt 199.0 lb

## 2023-11-06 DIAGNOSIS — M4802 Spinal stenosis, cervical region: Secondary | ICD-10-CM | POA: Insufficient documentation

## 2023-11-06 DIAGNOSIS — M5412 Radiculopathy, cervical region: Secondary | ICD-10-CM

## 2023-11-06 DIAGNOSIS — M25512 Pain in left shoulder: Secondary | ICD-10-CM

## 2023-11-06 DIAGNOSIS — M25511 Pain in right shoulder: Secondary | ICD-10-CM | POA: Diagnosis not present

## 2023-11-06 NOTE — Patient Instructions (Signed)
Cervical epidural See me again in 6 weeks

## 2023-11-06 NOTE — Assessment & Plan Note (Signed)
Severe overall, causing C5-6 radicular symptoms on the left side seems to be fairly severe overall.  Discussed with patient about different treatment options.  Wants to avoid any surgical intervention.  We discussed epidural.  Patient will try to do that in the near future.  Follow-up with me again in 6 to 8 weeks afterwards for further evaluation.  Not having significant weakness but still has the atrophy of the forearm on the left side that we will monitor.

## 2023-11-18 NOTE — Discharge Instructions (Signed)

## 2023-11-19 ENCOUNTER — Ambulatory Visit
Admission: RE | Admit: 2023-11-19 | Discharge: 2023-11-19 | Disposition: A | Discharge status: Home / Self Care | Payer: BC Managed Care – PPO | Source: Ambulatory Visit | Attending: Family Medicine | Admitting: Family Medicine

## 2023-11-19 DIAGNOSIS — M5412 Radiculopathy, cervical region: Secondary | ICD-10-CM

## 2023-11-19 MED ORDER — TRIAMCINOLONE ACETONIDE 40 MG/ML IJ SUSP (RADIOLOGY)
60.0000 mg | Freq: Once | INTRAMUSCULAR | Status: AC
  Administered 2023-11-19: 60 mg via EPIDURAL

## 2023-11-19 MED ORDER — IOPAMIDOL (ISOVUE-M 300) INJECTION 61%
1.0000 mL | Freq: Once | INTRAMUSCULAR | Status: AC | PRN
  Administered 2023-11-19: 1 mL via EPIDURAL

## 2023-12-03 ENCOUNTER — Other Ambulatory Visit: Payer: Self-pay

## 2023-12-03 ENCOUNTER — Other Ambulatory Visit (HOSPITAL_COMMUNITY): Payer: Self-pay

## 2023-12-03 MED ORDER — HYDROXYZINE PAMOATE 25 MG PO CAPS
25.0000 mg | ORAL_CAPSULE | Freq: Three times a day (TID) | ORAL | 1 refills | Status: AC | PRN
  Filled 2023-12-03: qty 30, 10d supply, fill #0

## 2023-12-03 MED ORDER — SILDENAFIL CITRATE 50 MG PO TABS
50.0000 mg | ORAL_TABLET | Freq: Every day | ORAL | 1 refills | Status: AC | PRN
  Filled 2023-12-03: qty 30, 15d supply, fill #0

## 2023-12-03 MED ORDER — PANTOPRAZOLE SODIUM 40 MG PO TBEC
40.0000 mg | DELAYED_RELEASE_TABLET | Freq: Every day | ORAL | 0 refills | Status: DC
  Filled 2023-12-03 – 2024-03-03 (×3): qty 90, 90d supply, fill #0

## 2023-12-28 ENCOUNTER — Other Ambulatory Visit (HOSPITAL_COMMUNITY): Payer: Self-pay

## 2023-12-28 MED ORDER — ROSUVASTATIN CALCIUM 40 MG PO TABS
40.0000 mg | ORAL_TABLET | ORAL | 3 refills | Status: AC
  Filled 2023-12-28: qty 45, 90d supply, fill #0
  Filled 2024-05-23: qty 15, 30d supply, fill #1
  Filled 2024-06-23: qty 15, 30d supply, fill #2
  Filled 2024-08-04: qty 15, 30d supply, fill #3
  Filled 2024-08-04: qty 15, 30d supply, fill #0
  Filled 2024-10-04: qty 15, 30d supply, fill #1
  Filled 2024-10-27: qty 15, 30d supply, fill #0

## 2024-02-13 ENCOUNTER — Other Ambulatory Visit (HOSPITAL_COMMUNITY): Payer: Self-pay

## 2024-02-15 ENCOUNTER — Other Ambulatory Visit: Payer: Self-pay

## 2024-02-15 ENCOUNTER — Other Ambulatory Visit (HOSPITAL_COMMUNITY): Payer: Self-pay

## 2024-02-15 MED ORDER — LEVOTHYROXINE SODIUM 25 MCG PO TABS
25.0000 ug | ORAL_TABLET | Freq: Every day | ORAL | 3 refills | Status: AC
  Filled 2024-02-15 – 2024-02-17 (×2): qty 90, 90d supply, fill #0
  Filled 2024-05-23: qty 30, 30d supply, fill #1
  Filled 2024-06-29: qty 30, 30d supply, fill #2
  Filled 2024-08-04: qty 30, 30d supply, fill #3
  Filled 2024-08-04: qty 30, 30d supply, fill #0
  Filled 2024-09-06: qty 30, 30d supply, fill #1
  Filled 2024-10-04: qty 30, 30d supply, fill #2
  Filled 2024-10-27 – 2024-10-31 (×2): qty 30, 30d supply, fill #0

## 2024-02-15 MED ORDER — LISINOPRIL 10 MG PO TABS
10.0000 mg | ORAL_TABLET | Freq: Every day | ORAL | 3 refills | Status: DC
  Filled 2024-02-15 – 2024-02-17 (×2): qty 90, 90d supply, fill #0
  Filled 2024-05-23: qty 30, 30d supply, fill #1
  Filled 2024-06-23: qty 30, 30d supply, fill #2
  Filled 2024-08-04: qty 30, 30d supply, fill #0
  Filled 2024-08-04: qty 30, 30d supply, fill #3
  Filled 2024-09-06: qty 30, 30d supply, fill #1
  Filled 2024-10-04: qty 30, 30d supply, fill #2
  Filled 2024-10-27 – 2024-10-31 (×2): qty 30, 30d supply, fill #0

## 2024-02-16 ENCOUNTER — Other Ambulatory Visit: Payer: Self-pay

## 2024-02-16 ENCOUNTER — Encounter: Payer: Self-pay | Admitting: Pharmacist

## 2024-02-17 ENCOUNTER — Other Ambulatory Visit (HOSPITAL_COMMUNITY): Payer: Self-pay

## 2024-02-23 ENCOUNTER — Other Ambulatory Visit (HOSPITAL_COMMUNITY): Payer: Self-pay

## 2024-02-24 ENCOUNTER — Other Ambulatory Visit: Payer: Self-pay

## 2024-02-26 ENCOUNTER — Other Ambulatory Visit (HOSPITAL_COMMUNITY): Payer: Self-pay

## 2024-03-03 ENCOUNTER — Other Ambulatory Visit (HOSPITAL_COMMUNITY): Payer: Self-pay

## 2024-03-04 ENCOUNTER — Other Ambulatory Visit (HOSPITAL_COMMUNITY): Payer: Self-pay

## 2024-05-23 ENCOUNTER — Other Ambulatory Visit (HOSPITAL_COMMUNITY): Payer: Self-pay

## 2024-06-23 ENCOUNTER — Other Ambulatory Visit (HOSPITAL_COMMUNITY): Payer: Self-pay

## 2024-06-24 ENCOUNTER — Other Ambulatory Visit: Payer: Self-pay

## 2024-06-25 ENCOUNTER — Other Ambulatory Visit (HOSPITAL_COMMUNITY): Payer: Self-pay

## 2024-06-30 ENCOUNTER — Other Ambulatory Visit (HOSPITAL_COMMUNITY): Payer: Self-pay

## 2024-07-05 ENCOUNTER — Other Ambulatory Visit (HOSPITAL_COMMUNITY): Payer: Self-pay

## 2024-07-05 ENCOUNTER — Encounter: Payer: Self-pay | Admitting: Pharmacist

## 2024-07-05 ENCOUNTER — Other Ambulatory Visit: Payer: Self-pay

## 2024-07-05 MED ORDER — PANTOPRAZOLE SODIUM 40 MG PO TBEC
40.0000 mg | DELAYED_RELEASE_TABLET | Freq: Every day | ORAL | 0 refills | Status: DC
  Filled 2024-07-05 – 2024-07-14 (×2): qty 90, 90d supply, fill #0

## 2024-07-08 ENCOUNTER — Other Ambulatory Visit: Payer: Self-pay

## 2024-07-14 ENCOUNTER — Other Ambulatory Visit (HOSPITAL_COMMUNITY): Payer: Self-pay

## 2024-07-15 ENCOUNTER — Other Ambulatory Visit: Payer: Self-pay

## 2024-07-20 ENCOUNTER — Emergency Department (HOSPITAL_BASED_OUTPATIENT_CLINIC_OR_DEPARTMENT_OTHER)
Admission: EM | Admit: 2024-07-20 | Discharge: 2024-07-20 | Disposition: A | Discharge status: Home / Self Care | Attending: Emergency Medicine | Admitting: Emergency Medicine

## 2024-07-20 ENCOUNTER — Other Ambulatory Visit: Payer: Self-pay

## 2024-07-20 ENCOUNTER — Emergency Department (HOSPITAL_BASED_OUTPATIENT_CLINIC_OR_DEPARTMENT_OTHER)

## 2024-07-20 DIAGNOSIS — Z79899 Other long term (current) drug therapy: Secondary | ICD-10-CM | POA: Insufficient documentation

## 2024-07-20 DIAGNOSIS — R079 Chest pain, unspecified: Secondary | ICD-10-CM

## 2024-07-20 DIAGNOSIS — I10 Essential (primary) hypertension: Secondary | ICD-10-CM | POA: Diagnosis not present

## 2024-07-20 DIAGNOSIS — E039 Hypothyroidism, unspecified: Secondary | ICD-10-CM | POA: Insufficient documentation

## 2024-07-20 DIAGNOSIS — R0789 Other chest pain: Secondary | ICD-10-CM | POA: Diagnosis present

## 2024-07-20 DIAGNOSIS — R0602 Shortness of breath: Secondary | ICD-10-CM | POA: Diagnosis not present

## 2024-07-20 DIAGNOSIS — Z7982 Long term (current) use of aspirin: Secondary | ICD-10-CM | POA: Diagnosis not present

## 2024-07-20 LAB — CBC WITH DIFFERENTIAL/PLATELET
Abs Immature Granulocytes: 0.01 K/uL (ref 0.00–0.07)
Basophils Absolute: 0 K/uL (ref 0.0–0.1)
Basophils Relative: 0 %
Eosinophils Absolute: 0.1 K/uL (ref 0.0–0.5)
Eosinophils Relative: 2 %
HCT: 39.1 % (ref 39.0–52.0)
Hemoglobin: 13.5 g/dL (ref 13.0–17.0)
Immature Granulocytes: 0 %
Lymphocytes Relative: 15 %
Lymphs Abs: 1 K/uL (ref 0.7–4.0)
MCH: 31.2 pg (ref 26.0–34.0)
MCHC: 34.5 g/dL (ref 30.0–36.0)
MCV: 90.3 fL (ref 80.0–100.0)
Monocytes Absolute: 0.6 K/uL (ref 0.1–1.0)
Monocytes Relative: 9 %
Neutro Abs: 5 K/uL (ref 1.7–7.7)
Neutrophils Relative %: 74 %
Platelets: 230 K/uL (ref 150–400)
RBC: 4.33 MIL/uL (ref 4.22–5.81)
RDW: 12.7 % (ref 11.5–15.5)
WBC: 6.7 K/uL (ref 4.0–10.5)
nRBC: 0 % (ref 0.0–0.2)

## 2024-07-20 LAB — BASIC METABOLIC PANEL WITH GFR
Anion gap: 14 (ref 5–15)
BUN: 10 mg/dL (ref 6–20)
CO2: 23 mmol/L (ref 22–32)
Calcium: 9.3 mg/dL (ref 8.9–10.3)
Chloride: 104 mmol/L (ref 98–111)
Creatinine, Ser: 0.81 mg/dL (ref 0.61–1.24)
GFR, Estimated: >60 mL/min (ref >60)
Glucose, Bld: 87 mg/dL (ref 70–99)
Potassium: 3.8 mmol/L (ref 3.5–5.1)
Sodium: 140 mmol/L (ref 135–145)

## 2024-07-20 LAB — TROPONIN T, HIGH SENSITIVITY
Troponin T High Sensitivity: <15 ng/L (ref 0–19)
Troponin T High Sensitivity: <15 ng/L (ref 0–19)

## 2024-07-20 NOTE — ED Provider Notes (Signed)
 [place] EMERGENCY DEPARTMENT AT [facility] Provider Note   CSN: [id number] Arrival date & time: 07/20/24  1702     Patient presents with: No chief complaint on file.   Gabriel Richards is a 58 y.o. male with a past medical history significant for hypertension, GERD, hyperlipidemia, and hypothyroidism who presents to the ED due to possible nail in left chest.  Patient picked up a nail gun yesterday which fell back on his chest and possibly inserted a nail in chest. No blood following incident or puncture wounds. Patient admits to some chest pain with deep inspiration and palpation to left chest wall.  Patient is unsure whether or not the nail went into his chest.  No history of blood clots.  Denies lower extremity edema.  Admits to some shortness of breath.  No cardiac history.  No other injuries from nail gun.  History obtained from patient and past medical records. No interpreter used during encounter.      Prior to Admission medications   Medication Sig Start Date End Date Taking? Authorizing Provider  albuterol (VENTOLIN HFA) 108 (90 Base) MCG/ACT inhaler Inhale into the lungs. 12/22/19   [provider]  aspirin 81 MG tablet Take 81 mg by mouth daily.    [provider]  Cholecalciferol 100 MCG (4000 UT) CAPS Take by mouth. Once daily    [provider]  cholestyramine  (QUESTRAN ) 4 g packet Take 1 packet mixed with fluid as directed daily with supper 12/05/21   Avram Lupita BRAVO, MD  Dexlansoprazole  30 MG capsule DR Take 1 capsule by mouth daily 04/22/22     hydrOXYzine  (VISTARIL ) 25 MG capsule Take 1 capsule (25 mg total) by mouth 3 (three) times daily as needed for anxiety. 12/03/23     levothyroxine  (SYNTHROID ) 25 MCG tablet Take 25 mcg by mouth daily. 08/29/20   [provider]  levothyroxine  (SYNTHROID ) 25 MCG tablet TAKE 1 TABLET BY MOUTH ONCE DAILY 12/06/21     levothyroxine  (SYNTHROID ) 25 MCG tablet Take 1 tablet (25 mcg total) by mouth  daily. 02/15/24     lisinopril  (ZESTRIL ) 10 MG tablet Take 1 tablet (10 mg total) by mouth daily. 02/15/24     LORazepam  (ATIVAN ) 1 MG tablet Take 1 mg by mouth as needed. 12/01/19   [provider]  LORazepam  (ATIVAN ) 1 MG tablet Take 1 tablet (1 mg total) by mouth daily as needed (shortness of breath and anxiety) 10/29/22     pantoprazole  (PROTONIX ) 40 MG tablet Take 1 tablet (40 mg total) by mouth daily. 07/05/24     rosuvastatin  (CRESTOR ) 40 MG tablet TAKE 1 TABLET BY MOUTH EVERY OTHER DAY 12/11/20 12/11/21  Deanna Toribio BRAVO., MD  rosuvastatin  (CRESTOR ) 40 MG tablet Take 1 tablet by mouth every other day. 01/15/22     rosuvastatin  (CRESTOR ) 40 MG tablet Take 1 tablet (40 mg total) by mouth every other day. 12/28/23     sildenafil  (REVATIO ) 20 MG tablet TAKE 1-3 TABLETS BY MOUTH DAILY AS NEEDED. 10/16/21     sildenafil  (VIAGRA ) 50 MG tablet Take 1-2 tablets (50-100 mg total) by mouth daily as needed for erectile dysfunction. 12/03/23     omeprazole  (PRILOSEC) 40 MG capsule Take 1 capsule by mouth daily. 10/28/21 04/22/22      Allergies: Patient has no known allergies.    Review of Systems  Respiratory:  Positive for shortness of breath.   Cardiovascular:  Positive for chest pain.    Updated Vital Signs BP (!) 144/95  Pulse 74   Temp 98.5 F (36.9 C) (Oral)   Resp 17   Ht 5' 11 (1.803 m)   Wt 81.6 kg   SpO2 97%   BMI 25.10 kg/m   Physical Exam Vitals and nursing note reviewed.  Constitutional:      General: He is not in acute distress.    Appearance: He is not ill-appearing.  HENT:     Head: Normocephalic.  Eyes:     Pupils: Pupils are equal, round, and reactive to light.  Cardiovascular:     Rate and Rhythm: Normal rate and regular rhythm.     Pulses: Normal pulses.     Heart sounds: Normal heart sounds. No murmur heard.    No friction rub. No gallop.  Pulmonary:     Effort: Pulmonary effort is normal.     Breath sounds: Normal breath sounds.     Comments: Respirations  equal and unlabored, patient able to speak in full sentences, lungs clear to auscultation bilaterally Chest:     Comments: No tenderness to chest wall. No obvious foreign body to chest wall Abdominal:     General: Abdomen is flat. There is no distension.     Palpations: Abdomen is soft.     Tenderness: There is no abdominal tenderness. There is no guarding or rebound.  Musculoskeletal:        General: Normal range of motion.     Cervical back: Neck supple.  Skin:    General: Skin is warm and dry.  Neurological:     General: No focal deficit present.     Mental Status: He is alert.  Psychiatric:        Mood and Affect: Mood normal.        Behavior: Behavior normal.     (all labs ordered are listed, but only abnormal results are displayed) Labs Reviewed  CBC WITH DIFFERENTIAL/PLATELET  BASIC METABOLIC PANEL WITH GFR  TROPONIN T, HIGH SENSITIVITY  TROPONIN T, HIGH SENSITIVITY    EKG: EKG Interpretation Date/Time:  Wednesday July 20 2024 17:39:22 EDT Ventricular Rate:  75 PR Interval:  141 QRS Duration:  102 QT Interval:  381 QTC Calculation: 426 R Axis:   22  Text Interpretation: Sinus rhythm Low voltage, precordial leads Borderline T abnormalities, inferior leads Confirmed by Randol Simmonds [phone]) on 07/20/2024 5:46:24 PM  Radiology: [facility] Chest Portable 1 View Result Date: 07/20/2024 CLINICAL DATA:  Possible foreign body. EXAM: PORTABLE CHEST 1 VIEW COMPARISON:  Chest radiograph dated 02/22/2015. FINDINGS: The heart size and mediastinal contours are within normal limits. Both lungs are clear. The visualized skeletal structures are unremarkable. IMPRESSION: No active disease. Electronically Signed   By: Vanetta Chou M.D.   On: 07/20/2024 17:40     Procedures   Medications Ordered in the ED - No data to display                                  Medical Decision Making Amount and/or Complexity of Data Reviewed Independent Historian: spouse Labs: ordered.  Decision-making details documented in ED Course. Radiology: ordered and independent interpretation performed. Decision-making details documented in ED Course. ECG/medicine tests: ordered and independent interpretation performed. Decision-making details documented in ED Course.   This patient presents to the ED for concern of CP, this involves an extensive number of treatment options, and is a complaint that carries with it a high risk of complications and morbidity.  The differential diagnosis includes foreign body, ACS, PE, aortic dissection, MSK etiology, etc  58 year old male presents to the ED due to possible nail in chest.  Patient caught a nail gun yesterday which possibly shot off a nail into the chest.  Admits to some chest pain and shortness of breath. No puncture wounds or blood from area yesterday. No other injuries.  Upon arrival, stable vitals.  O2 saturation at 96%.  On exam no obvious foreign body in chest wall.  Lungs clear to auscultation bilaterally.  Chest x-ray ordered to rule out foreign body.  EKG ordered. If negative, will add cardiac labs.   Chest x-ray personally reviewed and interpreted which is negative for any acute abnormalities.  No foreign body.  CBC unremarkable.  No leukocytosis.  Normal hemoglobin.  BMP unremarkable.  Normal renal function.  No major electrolyte derangements.  Troponin x 2 normal.  EKG normal sinus rhythm.  No signs of acute ischemia.  Low suspicion for ACS.  Presentation not concerning for PE or aortic dissection.  Possible MSK etiology given reproducible nature on exam.  Advised patient to take over-the-counter ibuprofen  as needed for pain.  Follow-up with PCP in a few days for recheck.  Low suspicion for any emergent cardiac etiologies of chest pain.  Patient stable for discharge. Strict ED precautions discussed with patient. Patient states understanding and agrees to plan. Patient discharged home in no acute distress and stable vitals  Co morbidities  that complicate the patient evaluation  HTN Cardiac Monitoring: / EKG:  The patient was maintained on a cardiac monitor.  I personally viewed and interpreted the cardiac monitored which showed an underlying rhythm of: NSR  Social Determinants of Health:  Has PCP  Test / Admission - Considered:  Considered admission; however cardiac work-up reassuring. Low suspicion for ACS, PE, aortic dissection     Final diagnoses:  Nonspecific chest pain    ED Discharge Orders     None          Lorelle Aleck JAYSON DEVONNA 07/20/24 2144    Randol Simmonds, MD 07/22/24 [phone]

## 2024-07-20 NOTE — Discharge Instructions (Signed)
 It was a pleasure taking care of you today.  As discussed, your workup was reassuring.  Please follow-up with PCP if symptoms do not improve.  Return to the ER for any worsening symptoms.

## 2024-07-20 NOTE — ED Triage Notes (Signed)
 Possible 1 brad nail in left chest. Dropped gun and  caught it whilst pointing at his left chest. Yesterday. Lung sounds present and =.

## 2024-08-04 ENCOUNTER — Other Ambulatory Visit (HOSPITAL_BASED_OUTPATIENT_CLINIC_OR_DEPARTMENT_OTHER): Payer: Self-pay

## 2024-08-04 ENCOUNTER — Other Ambulatory Visit (HOSPITAL_COMMUNITY): Payer: Self-pay

## 2024-09-06 ENCOUNTER — Other Ambulatory Visit (HOSPITAL_COMMUNITY): Payer: Self-pay

## 2024-09-19 NOTE — Progress Notes (Deleted)
 [name] [address] [place] [address] Phone: [phone] Subjective:    I'm seeing this patient by the request  of:  Mercer Garnette Barter, FNP  CC:   YEP:Dlagzrupcz  Demetrius Mahler is a 58 y.o. male coming in with complaint of cervical spine pain and arm numbness. Epidural in January 2025. Patient states        Past Medical History:  Diagnosis Date   Allergy    Arthritis    COVID-19    GERD (gastroesophageal reflux disease)    H. pylori infection    Hiatal hernia    Hx of adenomatous polyp of colon 06/03/2017   Hyperlipidemia    Hypertension    Hypothyroid    Past Surgical History:  Procedure Laterality Date   ANKLE SURGERY     APPENDECTOMY     CHOLECYSTECTOMY     UPPER GASTROINTESTINAL ENDOSCOPY  2015   H. pylori gastritis   Social History   Socioeconomic History   Marital status: Married    Spouse name: Not on file   Number of children: Not on file   Years of education: Not on file   Highest education level: Not on file  Occupational History   Not on file  Tobacco Use   Smoking status: Former   Smokeless tobacco: Never  Vaping Use   Vaping status: Never Used  Substance and Sexual Activity   Alcohol use: Yes    Alcohol/week: 4.0 standard drinks of alcohol    Types: 4 Cans of beer per week   Drug use: No   Sexual activity: Yes    Partners: Female  Other Topics Concern   Not on file  Social History Narrative   Married to [place], 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 : Never true  Transportation Needs: No Transportation Needs (12/03/2023)   Received from [facility]    In the past 12 months, has lack of reliable transportation kept you from medical appointments, meetings, work or from getting things needed for daily living? : No  Physical Activity: Not on file  Stress: Not on file  Social Connections: Unknown (03/19/2023)   Received from [employer]   Social Network    Social Network: Not on file   No Known Allergies Family History  Problem Relation Age of Onset   Hyperlipidemia Father    Heart disease Father    Hypertension Father    Diabetes Father    Hypertension Brother    Hyperlipidemia Brother    Early death Neg Hx    Cancer Neg Hx    COPD Neg Hx    Drug abuse Neg Hx    Alcohol abuse Neg Hx    Kidney disease Neg Hx    Stroke Neg Hx    Colon cancer Neg Hx    Esophageal cancer Neg Hx    Rectal cancer Neg Hx  Stomach cancer Neg Hx     Current Outpatient Medications (Endocrine & Metabolic):    levothyroxine  (SYNTHROID ) 25 MCG tablet, Take 25 mcg by mouth daily.   levothyroxine  (SYNTHROID ) 25 MCG tablet, TAKE 1 TABLET BY MOUTH ONCE DAILY   levothyroxine  (SYNTHROID ) 25 MCG tablet, Take 1 tablet (25 mcg total) by mouth daily.  Current Outpatient Medications (Cardiovascular):    cholestyramine  (QUESTRAN ) 4 g packet, Take 1 packet mixed with fluid as directed daily with supper   lisinopril  (ZESTRIL ) 10 MG tablet, Take 1 tablet (10 mg total) by mouth daily.   rosuvastatin  (CRESTOR ) 40 MG tablet, TAKE 1 TABLET BY MOUTH EVERY OTHER DAY   rosuvastatin  (CRESTOR ) 40 MG tablet, Take 1 tablet by mouth every other day.   rosuvastatin  (CRESTOR ) 40 MG tablet, Take 1 tablet (40 mg total) by mouth every other day.   sildenafil  (REVATIO ) 20 MG tablet, TAKE 1-3 TABLETS BY MOUTH DAILY AS NEEDED.   sildenafil  (VIAGRA ) 50 MG tablet, Take 1-2 tablets (50-100 mg total) by mouth daily as needed for erectile dysfunction.  Current  Outpatient Medications (Respiratory):    albuterol (VENTOLIN HFA) 108 (90 Base) MCG/ACT inhaler, Inhale into the lungs.  Current Outpatient Medications (Analgesics):    aspirin 81 MG tablet, Take 81 mg by mouth daily.   Current Outpatient Medications (Other):    Cholecalciferol 100 MCG (4000 UT) CAPS, Take by mouth. Once daily   Dexlansoprazole  30 MG capsule DR, Take 1 capsule by mouth daily   hydrOXYzine  (VISTARIL ) 25 MG capsule, Take 1 capsule (25 mg total) by mouth 3 (three) times daily as needed for anxiety.   LORazepam  (ATIVAN ) 1 MG tablet, Take 1 mg by mouth as needed.   LORazepam  (ATIVAN ) 1 MG tablet, Take 1 tablet (1 mg total) by mouth daily as needed (shortness of breath and anxiety)   pantoprazole  (PROTONIX ) 40 MG tablet, Take 1 tablet (40 mg total) by mouth daily.   Reviewed prior external information including notes and imaging from  primary care provider As well as notes that were available from care everywhere and other healthcare systems.  Past medical history, social, surgical and family history all reviewed in electronic medical record.  No pertanent information unless stated regarding to the chief complaint.   Review of Systems:  No headache, visual changes, nausea, vomiting, diarrhea, constipation, dizziness, abdominal pain, skin rash, fevers, chills, night sweats, weight loss, swollen lymph nodes, body aches, joint swelling, chest pain, shortness of breath, mood changes. POSITIVE muscle aches  Objective  There were no vitals taken for this visit.   General: No apparent distress alert and oriented x3 mood and affect normal, dressed appropriately.  HEENT: Pupils equal, extraocular movements intact  Respiratory: Patient's speak in full sentences and does not appear short of breath  Cardiovascular: No lower extremity edema, non tender, no erythema      Impression and Recommendations:

## 2024-09-20 ENCOUNTER — Ambulatory Visit: Admitting: Family Medicine

## 2024-09-20 VITALS — BP 138/80 | HR 61 | Ht 71.0 in | Wt 199.0 lb

## 2024-09-20 DIAGNOSIS — M4802 Spinal stenosis, cervical region: Secondary | ICD-10-CM

## 2024-09-20 DIAGNOSIS — M542 Cervicalgia: Secondary | ICD-10-CM

## 2024-09-20 DIAGNOSIS — M5412 Radiculopathy, cervical region: Secondary | ICD-10-CM

## 2024-09-20 DIAGNOSIS — G5602 Carpal tunnel syndrome, left upper limb: Secondary | ICD-10-CM

## 2024-09-20 NOTE — Assessment & Plan Note (Signed)
 Significant arthritis of the ankle.  Given a handicap placard today

## 2024-09-20 NOTE — Progress Notes (Signed)
 [facility] [address] [place] [address] Phone: [phone] Subjective:   Gabriel Richards am a scribe for Dr. Claudene.   I'Richards seeing this patient by the request  of:  Mercer Garnette Barter, FNP  CC: Neck pain follow-up  YEP:Dlagzrupcz  Gabriel Richards is a 58 y.o. male coming in with complaint of cervical spine pain and arm numbness. Epidural in January 2025. Patient states that it still having the numbness. Mainly when he is sleeping on his right side that his left arm will start getting numb. The injection lasted for about a month but the symptoms came back. He thinks there must be a pinched nerve.     Since we have seen patient unfortunately has been seen in the emergency room for nonspecific chest pain back in September.   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  Severe spinal stenosis also noted at C4-C5  Nerve conduction test showed a chronic C5 radiculopathy of the left upper extremity that was moderate to severe as well as carpal tunnel on the left side.  Past Medical History:  Diagnosis Date   Allergy    Arthritis    COVID-19    GERD (gastroesophageal reflux disease)    H. pylori infection    Hiatal hernia    Hx of adenomatous polyp of colon 06/03/2017   Hyperlipidemia    Hypertension    Hypothyroid    Past Surgical History:  Procedure Laterality Date   ANKLE SURGERY     APPENDECTOMY     CHOLECYSTECTOMY     UPPER GASTROINTESTINAL ENDOSCOPY  2015   H. pylori gastritis   Social History   Socioeconomic History   Marital status: Married    Spouse name: Not on file   Number of children: Not on file   Years of education: Not on file   Highest education level: Not on file  Occupational History   Not on file  Tobacco Use   Smoking status: Former   Smokeless tobacco: Never   Vaping Use   Vaping status: Never Used  Substance and Sexual Activity   Alcohol use: Yes    Alcohol/week: 4.0 standard drinks of alcohol    Types: 4 Cans of beer per week   Drug use: No   Sexual activity: Yes    Partners: Female  Other Topics Concern   Not on file  Social History Narrative   Married to [place], 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 : Never true  Transportation Needs: No Transportation Needs (12/03/2023)   Received from [facility]    In the past 12 months, has lack of reliable transportation kept you from medical appointments, meetings, work or from getting things needed for daily living? : No  Physical Activity: Not on file  Stress: Not on file  Social Connections: Unknown (03/19/2023)   Received from [facility]   Social Network    Social Network: Not on file   No Known Allergies Family History  Problem Relation Age of Onset   Hyperlipidemia Father    Heart disease Father    Hypertension Father    Diabetes Father    Hypertension Brother    Hyperlipidemia Brother    Early death Neg Hx    Cancer Neg Hx    COPD Neg Hx    Drug abuse Neg Hx    Alcohol abuse Neg Hx    Kidney disease Neg Hx    Stroke Neg Hx    Colon cancer Neg Hx    Esophageal cancer Neg Hx    Rectal cancer Neg Hx    Stomach cancer Neg Hx     Current Outpatient Medications (Endocrine & Metabolic):    levothyroxine  (SYNTHROID ) 25 MCG tablet, Take 25 mcg by mouth daily.   levothyroxine  (SYNTHROID ) 25 MCG tablet, TAKE 1  TABLET BY MOUTH ONCE DAILY   levothyroxine  (SYNTHROID ) 25 MCG tablet, Take 1 tablet (25 mcg total) by mouth daily.  Current Outpatient Medications (Cardiovascular):    cholestyramine  (QUESTRAN ) 4 g packet, Take 1 packet mixed with fluid as directed daily with supper   lisinopril  (ZESTRIL ) 10 MG tablet, Take 1 tablet (10 mg total) by mouth daily.   rosuvastatin  (CRESTOR ) 40 MG tablet, TAKE 1 TABLET BY MOUTH EVERY OTHER DAY   rosuvastatin  (CRESTOR ) 40 MG tablet, Take 1 tablet by mouth every other day.   rosuvastatin  (CRESTOR ) 40 MG tablet, Take 1 tablet (40 mg total) by mouth every other day.   sildenafil  (REVATIO ) 20 MG tablet, TAKE 1-3 TABLETS BY MOUTH DAILY AS NEEDED.   sildenafil  (VIAGRA ) 50 MG tablet, Take 1-2 tablets (50-100 mg total) by mouth daily as needed for erectile dysfunction.  Current Outpatient Medications (Respiratory):    albuterol (VENTOLIN HFA) 108 (90 Base) MCG/ACT inhaler, Inhale into the lungs.  Current Outpatient Medications (Analgesics):    aspirin 81 MG tablet, Take 81 mg by mouth daily.   Current Outpatient Medications (Other):    Cholecalciferol 100 MCG (4000 UT) CAPS, Take by mouth. Once daily   Dexlansoprazole  30 MG capsule DR, Take 1 capsule by mouth daily   hydrOXYzine  (VISTARIL ) 25 MG capsule, Take 1 capsule (25 mg total) by mouth 3 (three) times daily as needed for anxiety.   LORazepam  (ATIVAN ) 1 MG tablet, Take 1 mg by mouth as needed.   LORazepam  (ATIVAN ) 1 MG tablet, Take 1 tablet (1 mg total) by mouth daily as needed (shortness of breath and anxiety)   pantoprazole  (PROTONIX ) 40 MG tablet, Take 1 tablet (40 mg total) by mouth daily.   Reviewed prior external information including notes and imaging from  primary care provider As well as notes that were available from care everywhere and other healthcare systems.  Past medical history, social, surgical and family history all reviewed in electronic medical record.  No pertanent information unless  stated regarding to the chief complaint.   Review of Systems:  No headache, visual changes, nausea, vomiting, diarrhea, constipation, dizziness, abdominal pain, skin rash, fevers, chills, night sweats, weight loss, swollen lymph nodes, body aches, joint swelling, chest pain, shortness of breath, mood changes. POSITIVE muscle aches  Objective  Blood pressure 138/80, pulse 61, height 5' 11 (1.803 Richards), weight 199 lb (90.3 kg), SpO2 (!) 89%.   General: No apparent distress alert and oriented x3 mood  and affect normal, dressed appropriately.  HEENT: Pupils equal, extraocular movements intact  Respiratory: Patient's speak in full sentences and does not appear short of breath  Cardiovascular: No lower extremity edema, non tender, no erythema  Neck exam shows patient still has a positive Spurling's noted, radicular symptoms in the C5-C6 distribution.  Likely no significant weakness though of the grip strength.  Deep tendon reflexes of the upper extremity is intact    Impression and Recommendations:     The above documentation has been reviewed and is accurate and complete Gabriel Richards Seena Face, DO

## 2024-09-20 NOTE — Assessment & Plan Note (Signed)
 Has some carpal tunnel but thinks more of the symptoms especially with patient responding well to the epidural previously seems to be more secondary to cervical radiculopathy.  Follow-up again 6 to 12 weeks otherwise.  If having pain consider injection

## 2024-09-20 NOTE — Assessment & Plan Note (Signed)
 Patient did get approximately 100% better with the last epidural for all 4 months.  At this point I do want to repeat the epidural and see if we do get a longer duration.  We discussed evidence of increasing improvement with 3 within a 22-month period.  Patient will have this scheduled in the near future.  We discussed with patient about home exercises, icing regimen, which activities to do and which ones to avoid.  Follow-up with me again 6 to 8 weeks after the injection to see how patient is tolerating it.

## 2024-09-20 NOTE — Patient Instructions (Addendum)
 Good to see you. Epidural to C7 t1 at [facility].  Continue to be active. Keep hands in peripheral vision. Isometric Core Stability exercises. See me again in 2 months after the injection.

## 2024-09-21 ENCOUNTER — Ambulatory Visit: Admitting: Family Medicine

## 2024-10-04 ENCOUNTER — Other Ambulatory Visit (HOSPITAL_BASED_OUTPATIENT_CLINIC_OR_DEPARTMENT_OTHER): Payer: Self-pay

## 2024-10-04 ENCOUNTER — Other Ambulatory Visit

## 2024-10-10 NOTE — Discharge Instructions (Signed)

## 2024-10-11 ENCOUNTER — Inpatient Hospital Stay
Admission: RE | Admit: 2024-10-11 | Discharge: 2024-10-11 | Disposition: A | Source: Ambulatory Visit | Attending: Family Medicine | Admitting: Family Medicine

## 2024-10-11 DIAGNOSIS — M5412 Radiculopathy, cervical region: Secondary | ICD-10-CM

## 2024-10-11 DIAGNOSIS — M542 Cervicalgia: Secondary | ICD-10-CM

## 2024-10-11 MED ORDER — TRIAMCINOLONE ACETONIDE 40 MG/ML IJ SUSP (RADIOLOGY)
60.0000 mg | Freq: Once | INTRAMUSCULAR | Status: AC
  Administered 2024-10-11: 60 mg via EPIDURAL

## 2024-10-11 MED ORDER — IOPAMIDOL (ISOVUE-M 300) INJECTION 61%
1.0000 mL | Freq: Once | INTRAMUSCULAR | Status: AC | PRN
  Administered 2024-10-11: 1 mL via EPIDURAL

## 2024-10-27 ENCOUNTER — Other Ambulatory Visit (HOSPITAL_BASED_OUTPATIENT_CLINIC_OR_DEPARTMENT_OTHER): Payer: Self-pay

## 2024-10-27 ENCOUNTER — Other Ambulatory Visit: Payer: Self-pay

## 2024-10-27 ENCOUNTER — Other Ambulatory Visit (HOSPITAL_COMMUNITY): Payer: Self-pay

## 2024-10-27 MED ORDER — PANTOPRAZOLE SODIUM 40 MG PO TBEC
40.0000 mg | DELAYED_RELEASE_TABLET | Freq: Every day | ORAL | 1 refills | Status: AC
  Filled 2024-10-27: qty 90, 90d supply, fill #0

## 2024-10-31 ENCOUNTER — Other Ambulatory Visit: Payer: Self-pay

## 2024-11-01 ENCOUNTER — Other Ambulatory Visit: Payer: Self-pay

## 2024-11-14 ENCOUNTER — Other Ambulatory Visit: Payer: Self-pay

## 2024-11-22 ENCOUNTER — Ambulatory Visit: Admitting: Family Medicine

## 2024-12-02 ENCOUNTER — Other Ambulatory Visit (HOSPITAL_COMMUNITY): Payer: Self-pay

## 2024-12-02 MED ORDER — MELOXICAM 15 MG PO TABS
15.0000 mg | ORAL_TABLET | Freq: Every day | ORAL | 3 refills | Status: AC
  Filled 2024-12-02 – 2024-12-06 (×2): qty 30, 30d supply, fill #0

## 2024-12-02 MED ORDER — METOPROLOL SUCCINATE ER 25 MG PO TB24
25.0000 mg | ORAL_TABLET | Freq: Every day | ORAL | 3 refills | Status: DC
  Filled 2024-12-02 – 2024-12-06 (×2): qty 90, 90d supply, fill #0

## 2024-12-03 ENCOUNTER — Other Ambulatory Visit (HOSPITAL_COMMUNITY): Payer: Self-pay

## 2024-12-05 ENCOUNTER — Other Ambulatory Visit: Payer: Self-pay

## 2024-12-06 ENCOUNTER — Other Ambulatory Visit (HOSPITAL_COMMUNITY): Payer: Self-pay

## 2024-12-06 ENCOUNTER — Other Ambulatory Visit (HOSPITAL_BASED_OUTPATIENT_CLINIC_OR_DEPARTMENT_OTHER): Payer: Self-pay

## 2024-12-09 ENCOUNTER — Other Ambulatory Visit (HOSPITAL_COMMUNITY): Payer: Self-pay

## 2024-12-09 ENCOUNTER — Other Ambulatory Visit: Payer: Self-pay

## 2024-12-09 MED ORDER — METOPROLOL SUCCINATE ER 50 MG PO TB24
50.0000 mg | ORAL_TABLET | Freq: Every day | ORAL | 3 refills | Status: AC
  Filled 2024-12-09: qty 90, 90d supply, fill #0
